# Patient Record
Sex: Female | Born: 1935 | Race: White | Hispanic: No | Marital: Single | Smoking: Former smoker
Health system: Southern US, Community
[De-identification: ages and names within clinical notes are randomized; demographics above are authoritative.]

## PROBLEM LIST (undated history)

## (undated) DIAGNOSIS — N390 Urinary tract infection, site not specified: Secondary | ICD-10-CM

## (undated) DIAGNOSIS — E042 Nontoxic multinodular goiter: Secondary | ICD-10-CM

## (undated) DIAGNOSIS — C50919 Malignant neoplasm of unspecified site of unspecified female breast: Secondary | ICD-10-CM

## (undated) DIAGNOSIS — Z860100 Personal history of colon polyps, unspecified: Secondary | ICD-10-CM

## (undated) DIAGNOSIS — M25473 Effusion, unspecified ankle: Secondary | ICD-10-CM

## (undated) DIAGNOSIS — B9620 Unspecified Escherichia coli [E. coli] as the cause of diseases classified elsewhere: Secondary | ICD-10-CM

## (undated) HISTORY — DX: Personal history of colonic polyps: Z86.010

## (undated) HISTORY — PX: ABDOMINAL HYSTERECTOMY: SHX81

## (undated) HISTORY — DX: Effusion, unspecified ankle: M25.473

## (undated) HISTORY — DX: Personal history of colon polyps, unspecified: Z86.0100

## (undated) HISTORY — DX: Urinary tract infection, site not specified: N39.0

## (undated) HISTORY — DX: Nontoxic multinodular goiter: E04.2

## (undated) HISTORY — PX: TOTAL KNEE ARTHROPLASTY: SHX125

## (undated) HISTORY — DX: Malignant neoplasm of unspecified site of unspecified female breast: C50.919

## (undated) HISTORY — PX: MASTECTOMY: SHX3

## (undated) HISTORY — PX: BILATERAL SALPINGOOPHORECTOMY: SHX1223

## (undated) HISTORY — PX: COLONOSCOPY W/ POLYPECTOMY: SHX1380

## (undated) HISTORY — PX: TONSILLECTOMY: SUR1361

## (undated) HISTORY — DX: Unspecified Escherichia coli (E. coli) as the cause of diseases classified elsewhere: B96.20

## (undated) HISTORY — PX: CATARACT EXTRACTION, BILATERAL: SHX1313

## (undated) HISTORY — PX: APPENDECTOMY: SHX54

---

## 1998-04-21 ENCOUNTER — Ambulatory Visit (HOSPITAL_COMMUNITY): Admission: RE | Admit: 1998-04-21 | Discharge: 1998-04-21 | Payer: Self-pay | Admitting: Anesthesiology

## 1999-06-30 ENCOUNTER — Ambulatory Visit (HOSPITAL_COMMUNITY): Admission: RE | Admit: 1999-06-30 | Discharge: 1999-06-30 | Payer: Self-pay | Admitting: Obstetrics & Gynecology

## 2000-04-12 ENCOUNTER — Ambulatory Visit (HOSPITAL_COMMUNITY): Admission: RE | Admit: 2000-04-12 | Discharge: 2000-04-12 | Payer: Self-pay | Admitting: Internal Medicine

## 2000-04-13 ENCOUNTER — Ambulatory Visit (HOSPITAL_COMMUNITY): Admission: RE | Admit: 2000-04-13 | Discharge: 2000-04-13 | Payer: Self-pay | Admitting: Internal Medicine

## 2000-08-16 ENCOUNTER — Encounter: Payer: Self-pay | Admitting: Internal Medicine

## 2000-08-16 ENCOUNTER — Ambulatory Visit (HOSPITAL_COMMUNITY): Admission: RE | Admit: 2000-08-16 | Discharge: 2000-08-16 | Payer: Self-pay | Admitting: Internal Medicine

## 2001-02-13 ENCOUNTER — Ambulatory Visit (HOSPITAL_COMMUNITY): Admission: RE | Admit: 2001-02-13 | Discharge: 2001-02-13 | Payer: Self-pay | Admitting: Neurology

## 2001-02-13 ENCOUNTER — Encounter: Payer: Self-pay | Admitting: Neurology

## 2002-09-12 ENCOUNTER — Encounter: Admission: RE | Admit: 2002-09-12 | Discharge: 2002-09-12 | Payer: Self-pay | Admitting: Internal Medicine

## 2002-09-12 ENCOUNTER — Encounter: Payer: Self-pay | Admitting: Internal Medicine

## 2003-06-26 ENCOUNTER — Encounter: Payer: Self-pay | Admitting: Orthopedic Surgery

## 2003-07-06 ENCOUNTER — Inpatient Hospital Stay (HOSPITAL_COMMUNITY): Admission: RE | Admit: 2003-07-06 | Discharge: 2003-07-08 | Payer: Self-pay | Admitting: Orthopedic Surgery

## 2003-07-08 ENCOUNTER — Inpatient Hospital Stay (HOSPITAL_COMMUNITY)
Admission: RE | Admit: 2003-07-08 | Discharge: 2003-07-16 | Payer: Self-pay | Admitting: Physical Medicine & Rehabilitation

## 2003-07-11 ENCOUNTER — Encounter: Payer: Self-pay | Admitting: Physical Medicine & Rehabilitation

## 2004-01-29 ENCOUNTER — Other Ambulatory Visit: Admission: RE | Admit: 2004-01-29 | Discharge: 2004-01-29 | Payer: Self-pay | Admitting: Obstetrics and Gynecology

## 2004-08-09 ENCOUNTER — Ambulatory Visit (HOSPITAL_COMMUNITY): Admission: RE | Admit: 2004-08-09 | Discharge: 2004-08-09 | Payer: Self-pay | Admitting: Internal Medicine

## 2004-11-08 ENCOUNTER — Ambulatory Visit: Payer: Self-pay | Admitting: Internal Medicine

## 2005-03-28 ENCOUNTER — Ambulatory Visit: Payer: Self-pay | Admitting: Family Medicine

## 2005-04-11 ENCOUNTER — Ambulatory Visit: Payer: Self-pay | Admitting: Internal Medicine

## 2005-06-16 ENCOUNTER — Ambulatory Visit: Payer: Self-pay | Admitting: Internal Medicine

## 2005-09-14 ENCOUNTER — Ambulatory Visit: Payer: Self-pay | Admitting: Internal Medicine

## 2005-11-08 ENCOUNTER — Encounter: Admission: RE | Admit: 2005-11-08 | Discharge: 2005-11-08 | Payer: Self-pay | Admitting: Internal Medicine

## 2005-11-08 ENCOUNTER — Ambulatory Visit: Payer: Self-pay | Admitting: Internal Medicine

## 2005-11-14 ENCOUNTER — Ambulatory Visit (HOSPITAL_COMMUNITY): Admission: RE | Admit: 2005-11-14 | Discharge: 2005-11-14 | Payer: Self-pay | Admitting: Internal Medicine

## 2005-11-17 ENCOUNTER — Ambulatory Visit (HOSPITAL_COMMUNITY): Admission: RE | Admit: 2005-11-17 | Discharge: 2005-11-17 | Payer: Self-pay | Admitting: Internal Medicine

## 2005-11-24 ENCOUNTER — Ambulatory Visit: Payer: Self-pay | Admitting: Internal Medicine

## 2005-12-22 ENCOUNTER — Ambulatory Visit (HOSPITAL_COMMUNITY): Admission: RE | Admit: 2005-12-22 | Discharge: 2005-12-22 | Payer: Self-pay | Admitting: Surgery

## 2005-12-22 ENCOUNTER — Encounter (INDEPENDENT_AMBULATORY_CARE_PROVIDER_SITE_OTHER): Payer: Self-pay | Admitting: *Deleted

## 2005-12-25 ENCOUNTER — Ambulatory Visit: Payer: Self-pay | Admitting: Internal Medicine

## 2006-06-22 ENCOUNTER — Ambulatory Visit: Payer: Self-pay | Admitting: Internal Medicine

## 2006-06-28 ENCOUNTER — Ambulatory Visit: Payer: Self-pay | Admitting: Internal Medicine

## 2006-06-28 ENCOUNTER — Ambulatory Visit (HOSPITAL_COMMUNITY): Admission: RE | Admit: 2006-06-28 | Discharge: 2006-06-28 | Payer: Self-pay | Admitting: Surgery

## 2006-11-19 ENCOUNTER — Ambulatory Visit: Payer: Self-pay | Admitting: Internal Medicine

## 2007-06-28 ENCOUNTER — Ambulatory Visit: Payer: Self-pay | Admitting: Internal Medicine

## 2007-06-28 DIAGNOSIS — E042 Nontoxic multinodular goiter: Secondary | ICD-10-CM

## 2007-06-28 DIAGNOSIS — R32 Unspecified urinary incontinence: Secondary | ICD-10-CM

## 2007-06-28 DIAGNOSIS — K5900 Constipation, unspecified: Secondary | ICD-10-CM | POA: Insufficient documentation

## 2007-06-28 DIAGNOSIS — R609 Edema, unspecified: Secondary | ICD-10-CM

## 2007-06-29 ENCOUNTER — Encounter: Payer: Self-pay | Admitting: Internal Medicine

## 2007-07-03 ENCOUNTER — Encounter: Payer: Self-pay | Admitting: Internal Medicine

## 2007-07-09 ENCOUNTER — Encounter (INDEPENDENT_AMBULATORY_CARE_PROVIDER_SITE_OTHER): Payer: Self-pay | Admitting: *Deleted

## 2007-07-16 ENCOUNTER — Ambulatory Visit: Payer: Self-pay | Admitting: Internal Medicine

## 2007-07-17 ENCOUNTER — Encounter (INDEPENDENT_AMBULATORY_CARE_PROVIDER_SITE_OTHER): Payer: Self-pay | Admitting: *Deleted

## 2007-08-01 ENCOUNTER — Ambulatory Visit (HOSPITAL_COMMUNITY): Admission: RE | Admit: 2007-08-01 | Discharge: 2007-08-01 | Payer: Self-pay | Admitting: Surgery

## 2007-08-19 ENCOUNTER — Encounter: Payer: Self-pay | Admitting: Internal Medicine

## 2007-09-23 ENCOUNTER — Telehealth: Payer: Self-pay | Admitting: Internal Medicine

## 2007-09-24 ENCOUNTER — Encounter: Payer: Self-pay | Admitting: Internal Medicine

## 2007-09-24 ENCOUNTER — Encounter: Admission: RE | Admit: 2007-09-24 | Discharge: 2007-11-15 | Payer: Self-pay | Admitting: Internal Medicine

## 2007-10-03 ENCOUNTER — Encounter: Payer: Self-pay | Admitting: Internal Medicine

## 2007-10-29 ENCOUNTER — Ambulatory Visit: Payer: Self-pay | Admitting: Internal Medicine

## 2007-11-28 DIAGNOSIS — M25473 Effusion, unspecified ankle: Secondary | ICD-10-CM

## 2007-11-28 HISTORY — DX: Effusion, unspecified ankle: M25.473

## 2007-12-12 ENCOUNTER — Telehealth (INDEPENDENT_AMBULATORY_CARE_PROVIDER_SITE_OTHER): Payer: Self-pay | Admitting: *Deleted

## 2007-12-30 ENCOUNTER — Ambulatory Visit: Payer: Self-pay | Admitting: Internal Medicine

## 2008-01-01 ENCOUNTER — Encounter: Payer: Self-pay | Admitting: Internal Medicine

## 2008-01-02 ENCOUNTER — Telehealth (INDEPENDENT_AMBULATORY_CARE_PROVIDER_SITE_OTHER): Payer: Self-pay | Admitting: *Deleted

## 2008-01-08 ENCOUNTER — Encounter: Payer: Self-pay | Admitting: Internal Medicine

## 2008-02-07 ENCOUNTER — Telehealth (INDEPENDENT_AMBULATORY_CARE_PROVIDER_SITE_OTHER): Payer: Self-pay | Admitting: *Deleted

## 2008-03-03 ENCOUNTER — Telehealth: Payer: Self-pay | Admitting: Internal Medicine

## 2008-03-05 ENCOUNTER — Ambulatory Visit: Payer: Self-pay | Admitting: Internal Medicine

## 2008-03-08 LAB — CONVERTED CEMR LAB
ALT: 16 units/L (ref 0–35)
BUN: 15 mg/dL (ref 6–23)
Creatinine, Ser: 0.56 mg/dL (ref 0.40–1.20)
Potassium: 3.7 meq/L (ref 3.5–5.3)
Total Protein: 7 g/dL (ref 6.0–8.3)

## 2008-03-09 ENCOUNTER — Encounter (INDEPENDENT_AMBULATORY_CARE_PROVIDER_SITE_OTHER): Payer: Self-pay | Admitting: *Deleted

## 2008-04-23 ENCOUNTER — Telehealth (INDEPENDENT_AMBULATORY_CARE_PROVIDER_SITE_OTHER): Payer: Self-pay | Admitting: *Deleted

## 2008-06-03 ENCOUNTER — Ambulatory Visit: Payer: Self-pay | Admitting: Internal Medicine

## 2008-06-03 DIAGNOSIS — M25559 Pain in unspecified hip: Secondary | ICD-10-CM

## 2008-06-04 ENCOUNTER — Encounter (INDEPENDENT_AMBULATORY_CARE_PROVIDER_SITE_OTHER): Payer: Self-pay | Admitting: *Deleted

## 2008-06-05 ENCOUNTER — Encounter (INDEPENDENT_AMBULATORY_CARE_PROVIDER_SITE_OTHER): Payer: Self-pay | Admitting: *Deleted

## 2008-06-05 ENCOUNTER — Ambulatory Visit (HOSPITAL_COMMUNITY): Admission: RE | Admit: 2008-06-05 | Discharge: 2008-06-05 | Payer: Self-pay | Admitting: Internal Medicine

## 2008-06-24 ENCOUNTER — Ambulatory Visit: Payer: Self-pay | Admitting: Internal Medicine

## 2008-07-10 ENCOUNTER — Telehealth (INDEPENDENT_AMBULATORY_CARE_PROVIDER_SITE_OTHER): Payer: Self-pay | Admitting: *Deleted

## 2008-07-13 ENCOUNTER — Ambulatory Visit: Payer: Self-pay | Admitting: Internal Medicine

## 2008-07-13 ENCOUNTER — Observation Stay (HOSPITAL_COMMUNITY): Admission: EM | Admit: 2008-07-13 | Discharge: 2008-07-14 | Payer: Self-pay | Admitting: Family Medicine

## 2008-07-16 ENCOUNTER — Encounter: Payer: Self-pay | Admitting: Internal Medicine

## 2008-07-30 ENCOUNTER — Ambulatory Visit (HOSPITAL_COMMUNITY): Admission: RE | Admit: 2008-07-30 | Discharge: 2008-07-30 | Payer: Self-pay | Admitting: Surgery

## 2008-08-10 ENCOUNTER — Encounter: Payer: Self-pay | Admitting: Internal Medicine

## 2009-05-10 ENCOUNTER — Ambulatory Visit: Payer: Self-pay | Admitting: Internal Medicine

## 2009-05-10 DIAGNOSIS — Z860100 Personal history of colon polyps, unspecified: Secondary | ICD-10-CM | POA: Insufficient documentation

## 2009-05-10 DIAGNOSIS — M810 Age-related osteoporosis without current pathological fracture: Secondary | ICD-10-CM | POA: Insufficient documentation

## 2009-05-10 LAB — CONVERTED CEMR LAB
Basophils Absolute: 0 10*3/uL (ref 0.0–0.1)
Calcium: 9 mg/dL (ref 8.4–10.5)
Creatinine, Ser: 0.6 mg/dL (ref 0.4–1.2)
Eosinophils Absolute: 0.1 10*3/uL (ref 0.0–0.7)
Hemoglobin: 13.2 g/dL (ref 12.0–15.0)
Lymphocytes Relative: 28.6 % (ref 12.0–46.0)
MCHC: 34 g/dL (ref 30.0–36.0)
Monocytes Relative: 9.9 % (ref 3.0–12.0)
Neutrophils Relative %: 59.3 % (ref 43.0–77.0)
Platelets: 140 10*3/uL — ABNORMAL LOW (ref 150.0–400.0)
Potassium: 4 meq/L (ref 3.5–5.1)
RDW: 13.3 % (ref 11.5–14.6)
TSH: 0.41 microintl units/mL (ref 0.35–5.50)

## 2009-05-11 ENCOUNTER — Encounter (INDEPENDENT_AMBULATORY_CARE_PROVIDER_SITE_OTHER): Payer: Self-pay | Admitting: *Deleted

## 2009-05-14 LAB — CONVERTED CEMR LAB: Vit D, 25-Hydroxy: 66 ng/mL (ref 30–89)

## 2009-05-17 ENCOUNTER — Encounter (INDEPENDENT_AMBULATORY_CARE_PROVIDER_SITE_OTHER): Payer: Self-pay | Admitting: *Deleted

## 2009-05-28 ENCOUNTER — Encounter: Payer: Self-pay | Admitting: Internal Medicine

## 2009-05-28 ENCOUNTER — Telehealth (INDEPENDENT_AMBULATORY_CARE_PROVIDER_SITE_OTHER): Payer: Self-pay | Admitting: *Deleted

## 2009-07-29 ENCOUNTER — Ambulatory Visit (HOSPITAL_COMMUNITY): Admission: RE | Admit: 2009-07-29 | Discharge: 2009-07-29 | Payer: Self-pay | Admitting: Surgery

## 2009-08-06 ENCOUNTER — Ambulatory Visit: Payer: Self-pay | Admitting: Internal Medicine

## 2009-08-06 DIAGNOSIS — M2550 Pain in unspecified joint: Secondary | ICD-10-CM | POA: Insufficient documentation

## 2009-08-06 LAB — CONVERTED CEMR LAB: Rhuematoid fact SerPl-aCnc: <20 intl units/mL (ref 0.0–20.0)

## 2009-08-10 ENCOUNTER — Encounter (INDEPENDENT_AMBULATORY_CARE_PROVIDER_SITE_OTHER): Payer: Self-pay | Admitting: *Deleted

## 2009-08-10 ENCOUNTER — Telehealth (INDEPENDENT_AMBULATORY_CARE_PROVIDER_SITE_OTHER): Payer: Self-pay | Admitting: *Deleted

## 2009-08-20 ENCOUNTER — Encounter: Payer: Self-pay | Admitting: Internal Medicine

## 2009-09-07 ENCOUNTER — Encounter: Payer: Self-pay | Admitting: Internal Medicine

## 2009-09-21 ENCOUNTER — Encounter: Payer: Self-pay | Admitting: Internal Medicine

## 2009-09-24 ENCOUNTER — Telehealth: Payer: Self-pay | Admitting: Internal Medicine

## 2009-09-29 ENCOUNTER — Telehealth (INDEPENDENT_AMBULATORY_CARE_PROVIDER_SITE_OTHER): Payer: Self-pay | Admitting: *Deleted

## 2009-10-01 ENCOUNTER — Ambulatory Visit: Payer: Self-pay | Admitting: Internal Medicine

## 2009-10-04 ENCOUNTER — Encounter (INDEPENDENT_AMBULATORY_CARE_PROVIDER_SITE_OTHER): Payer: Self-pay | Admitting: *Deleted

## 2009-10-04 LAB — CONVERTED CEMR LAB
Free T4: 1.2 ng/dL (ref 0.6–1.6)
T3, Free: 3.1 pg/mL (ref 2.3–4.2)
TSH: 0.53 microintl units/mL (ref 0.35–5.50)

## 2009-10-15 ENCOUNTER — Ambulatory Visit: Payer: Self-pay | Admitting: Internal Medicine

## 2009-11-15 ENCOUNTER — Encounter: Payer: Self-pay | Admitting: Internal Medicine

## 2009-11-30 ENCOUNTER — Encounter: Payer: Self-pay | Admitting: Internal Medicine

## 2010-01-04 ENCOUNTER — Encounter: Payer: Self-pay | Admitting: Internal Medicine

## 2010-01-25 ENCOUNTER — Ambulatory Visit: Payer: Self-pay | Admitting: Internal Medicine

## 2010-01-25 DIAGNOSIS — Z853 Personal history of malignant neoplasm of breast: Secondary | ICD-10-CM

## 2010-02-04 ENCOUNTER — Ambulatory Visit: Payer: Self-pay | Admitting: Internal Medicine

## 2010-02-10 LAB — CONVERTED CEMR LAB
ALT: 21 units/L (ref 0–35)
AST: 28 units/L (ref 0–37)
Calcium: 9 mg/dL (ref 8.4–10.5)
Cholesterol: 177 mg/dL (ref 0–200)
GFR calc non Af Amer: 101.05 mL/min (ref >60)
Glucose, Bld: 90 mg/dL (ref 70–99)
HDL: 79.3 mg/dL (ref >39.00)
Potassium: 4.2 meq/L (ref 3.5–5.1)
Sodium: 141 meq/L (ref 135–145)
TSH: 0.61 microintl units/mL (ref 0.35–5.50)
Total Bilirubin: 0.6 mg/dL (ref 0.3–1.2)
Total Protein: 6.6 g/dL (ref 6.0–8.3)

## 2010-02-14 ENCOUNTER — Encounter: Payer: Self-pay | Admitting: Internal Medicine

## 2010-04-15 ENCOUNTER — Encounter: Payer: Self-pay | Admitting: Internal Medicine

## 2010-04-19 ENCOUNTER — Encounter: Payer: Self-pay | Admitting: Internal Medicine

## 2010-04-26 ENCOUNTER — Ambulatory Visit: Payer: Self-pay | Admitting: Internal Medicine

## 2010-04-26 DIAGNOSIS — R030 Elevated blood-pressure reading, without diagnosis of hypertension: Secondary | ICD-10-CM | POA: Insufficient documentation

## 2010-04-26 DIAGNOSIS — R7309 Other abnormal glucose: Secondary | ICD-10-CM | POA: Insufficient documentation

## 2010-05-02 LAB — CONVERTED CEMR LAB
Creatinine,U: 13.7 mg/dL
Microalb Creat Ratio: 9.5 mg/g (ref 0.0–30.0)

## 2010-05-12 ENCOUNTER — Telehealth (INDEPENDENT_AMBULATORY_CARE_PROVIDER_SITE_OTHER): Payer: Self-pay | Admitting: *Deleted

## 2010-08-05 ENCOUNTER — Telehealth: Payer: Self-pay | Admitting: Internal Medicine

## 2010-08-15 ENCOUNTER — Telehealth (INDEPENDENT_AMBULATORY_CARE_PROVIDER_SITE_OTHER): Payer: Self-pay | Admitting: *Deleted

## 2010-09-09 ENCOUNTER — Ambulatory Visit: Payer: Self-pay | Admitting: Internal Medicine

## 2010-09-09 DIAGNOSIS — IMO0001 Reserved for inherently not codable concepts without codable children: Secondary | ICD-10-CM

## 2010-09-09 DIAGNOSIS — M1710 Unilateral primary osteoarthritis, unspecified knee: Secondary | ICD-10-CM

## 2010-09-09 DIAGNOSIS — H04129 Dry eye syndrome of unspecified lacrimal gland: Secondary | ICD-10-CM | POA: Insufficient documentation

## 2010-09-12 LAB — CONVERTED CEMR LAB
Calcium: 9.9 mg/dL (ref 8.4–10.5)
Magnesium: 2.2 mg/dL (ref 1.5–2.5)
Vit D, 25-Hydroxy: 75 ng/mL (ref 30–89)

## 2010-11-09 ENCOUNTER — Emergency Department (HOSPITAL_COMMUNITY)
Admission: EM | Admit: 2010-11-09 | Discharge: 2010-11-09 | Payer: Self-pay | Source: Home / Self Care | Admitting: Family Medicine

## 2010-12-16 ENCOUNTER — Telehealth (INDEPENDENT_AMBULATORY_CARE_PROVIDER_SITE_OTHER): Payer: Self-pay | Admitting: *Deleted

## 2010-12-17 ENCOUNTER — Encounter: Payer: Self-pay | Admitting: Internal Medicine

## 2010-12-18 ENCOUNTER — Encounter: Payer: Self-pay | Admitting: Surgery

## 2010-12-25 LAB — CONVERTED CEMR LAB
Albumin: 4 g/dL (ref 3.5–5.2)
Alkaline Phosphatase: 56 units/L (ref 39–117)
BUN: 8 mg/dL (ref 6–23)
Basophils Absolute: 0 10*3/uL (ref 0.0–0.1)
Cholesterol: 165 mg/dL (ref 0–200)
Creatinine, Ser: 0.6 mg/dL (ref 0.4–1.2)
Free T4: 1.1 ng/dL (ref 0.6–1.6)
GFR calc Af Amer: 123 mL/min
HCT: 42 % (ref 36.0–46.0)
HDL: 68.3 mg/dL (ref >39.0)
Hemoglobin: 14.4 g/dL (ref 12.0–15.0)
Lymphocytes Relative: 38.3 % (ref 12.0–46.0)
MCHC: 34.2 g/dL (ref 30.0–36.0)
MCV: 91.1 fL (ref 78.0–100.0)
Monocytes Absolute: 0.7 10*3/uL (ref 0.2–0.7)
Monocytes Relative: 9.6 % (ref 3.0–11.0)
Neutro Abs: 3.4 10*3/uL (ref 1.4–7.7)
Neutrophils Relative %: 51 % (ref 43.0–77.0)
Potassium: 4 meq/L (ref 3.5–5.1)
Sodium: 137 meq/L (ref 135–145)
T3, Free: 2.9 pg/mL (ref 2.3–4.2)
TSH: 0.39 microintl units/mL (ref 0.35–5.50)
Total Bilirubin: 1 mg/dL (ref 0.3–1.2)
Total Protein: 6.7 g/dL (ref 6.0–8.3)

## 2010-12-29 NOTE — Progress Notes (Signed)
Summary: REFILL FOR FUROSIMIDE, BUT NEEDS THE 20 MG  Phone Note Call from Patient Call back at [facility] Phone [phone]   Caller: Patient Summary of Call: NEEDS REFILL FOR [facility]OSOMIDE [facility] [address] [address]  PATIENT WOULD PREFER A 90 DAY SUPPLY, BUT WOULD LIKE 20 MG INSTEAD OF 40 MG DUE TO CONVENIENCE BECAUSE SOMEDAYS SHE TAKES 20 MG AND SOME DAYS SHE SAYS SHE NEEDS THE 40 MG Initial call taken by: Jerolyn Shin,  May 12, 2010 4:23 PM    New/Updated Medications: FUROSEMIDE 20 MG TABS (FUROSEMIDE) 1-2 by mouth once daily Prescriptions: FUROSEMIDE 20 MG TABS (FUROSEMIDE) 1-2 by mouth once daily  #180 x 0   Entered by:   Shonna Chock   Authorized by:   Marga Melnick MD   Signed by:   Shonna Chock on 05/12/2010   Method used:   Electronically to        [facility] [phone]* (retail)       [address]       [place], [place]  [address]       Ph: [phone]       Fax: [phone]   RxID:   [id number]

## 2010-12-29 NOTE — Letter (Signed)
Summary: Stacey Drain MD Rheumatology  Stacey Drain MD Rheumatology   Imported By: Lanelle Bal 01/13/2010 08:41:14  _____________________________________________________________________  External Attachment:    Type:   Image     Comment:   External Document

## 2010-12-29 NOTE — Progress Notes (Signed)
Summary: patient decided on med  Phone Note Call from Patient Call back at Work Phone [phone]   Caller: Patient Summary of Call: patient wants rx for ditropan long acting in place of  [facility] [address] Initial call taken by: Okey Regal Spring,  August 15, 2010 10:20 AM  Follow-up for Phone Call        see Rx Follow-up by: Marga Melnick MD,  August 15, 2010 1:19 PM  Additional Follow-up for Phone Call Additional follow up Details #1::        left detailed msg prescription sent to pharmacy ...Marland KitchenMarland KitchenDoristine Devoid CMA  August 15, 2010 4:40 PM     New/Updated Medications: OXYBUTYNIN CHLORIDE 5 MG XR24H-TAB (OXYBUTYNIN CHLORIDE) 1 once daily Prescriptions: OXYBUTYNIN CHLORIDE 5 MG XR24H-TAB (OXYBUTYNIN CHLORIDE) 1 once daily  #30 x Rx5   Entered by:   Marga Melnick MD   Authorized by:   Loreen Freud DO   Signed by:   Marga Melnick MD on 08/15/2010   Method used:   Faxed to ...       [facility] [phone]* (retail)       [address]       [place], [place]  [address]       Ph: [phone]       Fax: [phone]   RxID:   [id number]

## 2010-12-29 NOTE — Consult Note (Signed)
Summary: [facility]  [facility]   Imported By: Lanelle Bal 04/30/2010 09:15:41  _____________________________________________________________________  External Attachment:    Type:   Image     Comment:   External Document

## 2010-12-29 NOTE — Progress Notes (Signed)
Summary: med too expensive  Phone Note Refill Request Call back at [phone]   Refills Requested: Medication #1:  VESICARE 5 MG  TABS 1 by mouth once daily pt states that med is too expensive would like to have a generic or a med that is equivalent to it. [facility] [address] pls advise...........Marland KitchenFelecia Deloach CMA  August 05, 2010 2:50 PM    Follow-up for Phone Call        per dr hopper pt can call pharmacy and ask them for cheaper alternative or she can bring in a copy of her formulary. pt ok, verbalized understanding ...............Marland KitchenFelecia Deloach CMA  August 05, 2010 5:05 PM

## 2010-12-29 NOTE — Assessment & Plan Note (Signed)
Summary: DISCUSS VIT D & BRAC TEST/RH......Marland Kitchen   Vital Signs:  Patient profile:   77 year old female Weight:      125.6 pounds Pulse rate:   72 / minute Resp:     17 per minute BP sitting:   122 / 78  (left arm) Cuff size:   regular  Vitals Entered By: Shonna Chock (January 25, 2010 4:21 PM) CC: Discuss getting an order for a new Vit D test and for a BRAC test Comments REVIEWED MED LIST, PATIENT AGREED DOSE AND INSTRUCTION CORRECT    CC:  Discuss getting an order for a new Vit D test and for a BRAC test.  History of Present Illness: On Fosamax 70 mg weekly as per Dr Kellie Simmering for Osteoporosis.His 01/04/2010 OV was reviewed. She is on vitamin D 1000 International Units once daily ; her level was 66 in 04/2009.She is also oon Caltrate D  600  two times a day . She is off MTX & Prednisone . Her daughter is questioning BRAC testing. Edema persists despite  Lasix  20 mg once daily & stockings.Not on Amlodipine; no excess salt intake.  Allergies: 1)  ! Pcn 2)  ! Sulfa  Past History:  Past Medical History: G 7 P6 goiter, nontoxic, multinodular urinary incontinence ankle edema constipation Colonic polyps, hx of Osteoporosis Breast cancer, bilaterally , hx of  Family History: Father: lung Cancer Mother: uremic poisoning due to RA Siblings: bro ruptured AAA; M aunt breast CA  Review of Systems CV:  Complains of swelling of feet; denies bluish discoloration of lips or nails, difficulty breathing at night, difficulty breathing while lying down, and swelling of hands; Cold induced digit change.  Physical Exam  General:  Thin ; alert,appropriate and cooperative throughout examination Lungs:  Normal respiratory effort, chest expands symmetrically. Lungs are clear to auscultation, no crackles or wheezes. Heart:  Normal rate and regular rhythm. S1 and S2 normal without gallop, murmur, click, rub. S4;no JVD and no HJR.   Extremities:  1+ left pedal edema and 1+ right pedal edema.  Venous  insufficiency   Impression & Recommendations:  Problem # 1:  ANKLE EDEMA (ICD-782.3)  The following medications were removed from the medication list:    Hydrochlorothiazide 25 Mg Tabs (Hydrochlorothiazide) .Marland Kitchen... 1/2 by mouth 3-4 x weekly Her updated medication list for this problem includes:    Furosemide 20 Mg Tabs (Furosemide) .Marland Kitchen... 1 by mouth once daily  Orders: T-2 View CXR (71020TC)  Problem # 2:  OSTEOPOROSIS (ICD-733.00)  The following medications were removed from the medication list:    Fosamax 70 Mg Tabs (Alendronate sodium) .Marland Kitchen... 1 by mouth weekly Her updated medication list for this problem includes:    Alendronate Sodium 70 Mg Tabs (Alendronate sodium) .Marland Kitchen... Weekly  Complete Medication List: 1)  Vesicare 5 Mg Tabs (Solifenacin succinate) .Marland Kitchen.. 1 by mouth once daily 2)  Calcium 600 Mg Tabs (Calcium) .Marland Kitchen.. 1 by mouth two times a day 3)  Centrum Silver Tabs (Multiple vitamins-minerals) .Marland Kitchen.. 1 by mouth once daily 4)  Vitamin D 1000 Unit Caps (Cholecalciferol) .... Take one capsule once daily 5)  Magnesium 250 Mg Tabs (Magnesium) .Marland Kitchen.. 1 by mouth once daily 6)  Fish Oil 1200 Mg Caps (Omega-3 fatty acids) .Marland Kitchen.. 1 by mouth two times a day 7)  Furosemide 20 Mg Tabs (Furosemide) .Marland Kitchen.. 1 by mouth once daily 8)  Alendronate Sodium 70 Mg Tabs (Alendronate sodium) .... Weekly  Patient Instructions: 1)  Schedule fasting labs: 2)  BMP ; vitamin D level; 3)  Hepatic Panel ; 4)  Lipid Panel ; 5)  TSH ; BNP 6)  Limit your Sodium (Salt) to less than 4 grams a day (slightly less than 1 teaspoon) to prevent fluid retention, swelling, or worsening or symptoms.

## 2010-12-29 NOTE — Letter (Signed)
Summary: Cancer Screening/[facility] Personalized Risk Profile  Cancer Screening/[facility] Personalized Risk Profile   Imported By: Lanelle Bal 02/17/2010 11:26:01  _____________________________________________________________________  External Attachment:    Type:   Image     Comment:   External Document

## 2010-12-29 NOTE — Letter (Signed)
Summary: BP & Glucose Screening/[facility]  BP & Glucose Screening/[facility]   Imported By: Lanelle Bal 05/04/2010 09:57:03  _____________________________________________________________________  External Attachment:    Type:   Image     Comment:   External Document

## 2010-12-29 NOTE — Letter (Signed)
Summary: Stacey Drain MD Rheumatology  Stacey Drain MD Rheumatology   Imported By: Lanelle Bal 02/26/2010 11:53:18  _____________________________________________________________________  External Attachment:    Type:   Image     Comment:   External Document

## 2010-12-29 NOTE — Assessment & Plan Note (Signed)
Summary: for dry eyes and other problem//ph   Vital Signs:  Patient profile:   77 year old female Weight:      118.4 pounds BMI:     22.09 Temp:     98.1 degrees F oral Pulse rate:   84 / minute Resp:     18 per minute BP sitting:   124 / 76  (left arm) Cuff size:   regular  Vitals Entered By: Shonna Chock CMA (September 09, 2010 10:40 AM) CC: 1.) Dry eye concerns - left eye is worse  2.)Neck concerns -left side  3.) Question how much magnesium to take  4.) Discuss joint pain and veins in legs, Lower Extremity Joint pain   CC:  1.) Dry eye concerns - left eye is worse  2.)Neck concerns -left side  3.) Question how much magnesium to take  4.) Discuss joint pain and veins in legs and Lower Extremity Joint pain.  History of Present Illness: Lower Extremity Joint Pain      This is an 77 year old woman who presents with Lower Extremity Joint pain for 12 months.  The patient reports swelling, stiffness  with prolonged standing, decreased ROM, and weakness, but denies redness, giving away, locking, and popping.  The pain is located in the left knee.  The pain began gradually and with no injury.  The pain is described as aching, intermittent, and activity(standing & walking > 5 min) related. She has had dry eyes for 6-8 months with  photosensitivity .The patient denies the following symptoms: fever, rash, diarrhea, and dysuria.  Rx: Aleve with benefit. PMH of R TKR 2006 by Dr Despina Hick.She indicates she would be reluctant to pursue TKR of the L knee.                                                She has several other issues she wishes to discuss, especially dry eyes & need for Mg++  supplementation. (see ROS)  Current Medications (verified): 1)  Calcium 600 Mg Tabs (Calcium) .Marland Kitchen.. 1 By Mouth Two Times A Day 2)  Centrum Silver  Tabs (Multiple Vitamins-Minerals) .Marland Kitchen.. 1 By Mouth Once Daily 3)  Vitamin D 1000 Unit  Caps (Cholecalciferol) .... Take One Capsule Once Daily 4)  Magnesium Oxide 400 Mg Caps  (Magnesium Oxide) .Marland Kitchen.. 1 By Mouth Once Daily 5)  Fish Oil 1200 Mg Caps (Omega-3 Fatty Acids) .Marland Kitchen.. 1 By Mouth Two Times A Day 6)  Furosemide 20 Mg Tabs (Furosemide) .Marland Kitchen.. 1-2 By Mouth Once Daily 7)  Oxybutynin Chloride 5 Mg Xr24h-Tab (Oxybutynin Chloride) .Marland Kitchen.. 1 Once Daily  Allergies: 1)  ! Pcn 2)  ! Sulfa  Review of Systems Eyes:  Denies discharge, eye pain, and red eye. GI:  Denies constipation and diarrhea. MS:  Complains of muscle aches; She is unsure who Rxed magnesium. She woke with neck pain this am.. Derm:  Denies lesion(s) and rash. Neuro:  Denies brief paralysis, numbness, and tingling.  Physical Exam  General:  in no acute distress; alert,appropriate and cooperative throughout examination Eyes:  No corneal or conjunctival inflammation noted. EOMI. Pupils small. Tearing appears normal. Vision grossly normal. Neck:  No deformities, masses; thyroid normal. Decreased ROM due to pain Msk:  Lay down & sat up  with minimal  help Extremities:  Lipedema @ ankles; varicose veins. Fusiform L knee with crepitus,  no definite effusion. Hands essentially normal. Neurologic:  alert & oriented X3, strength normal in all extremities, and DTRs symmetrical and normal.     Impression & Recommendations:  Problem # 1:  OSTEOARTHRITIS, KNEE, SEVERE (ICD-715.96) end stage Her updated medication list for this problem includes:    Tramadol Hcl 50 Mg Tabs (Tramadol hcl) .Marland Kitchen... 1/2 -1  every 6 hrs as needed for pain  Orders: Prescription Created Electronically [phone])  Problem # 2:  MUSCLE PAIN (ICD-729.1)  Orders: Venipuncture (56213) TLB-Calcium (82310-CA) TLB-Magnesium (Mg) (83735-MG)  Her updated medication list for this problem includes:    Tramadol Hcl 50 Mg Tabs (Tramadol hcl) .Marland Kitchen... 1/2 -1  every 6 hrs as needed for pain  Problem # 3:  DRY EYE SYNDROME (ICD-375.15)  Orders: Ophthalmology Referral (Ophthalmology)  Problem # 4:  OSTEOPOROSIS (ICD-733.00)  Orders: Venipuncture  (08657) T-Vitamin D (25-Hydroxy) (84696-29528)  Complete Medication List: 1)  Calcium 600 Mg Tabs (Calcium) .Marland Kitchen.. 1 by mouth two times a day 2)  Centrum Silver Tabs (Multiple vitamins-minerals) .Marland Kitchen.. 1 by mouth once daily 3)  Vitamin D 1000 Unit Caps (Cholecalciferol) .... Take one capsule once daily 4)  Magnesium Oxide 400 Mg Caps (Magnesium oxide) .Marland Kitchen.. 1 by mouth once daily 5)  Fish Oil 1200 Mg Caps (Omega-3 fatty acids) .Marland Kitchen.. 1 by mouth two times a day 6)  Furosemide 20 Mg Tabs (Furosemide) .Marland Kitchen.. 1-2 by mouth once daily 7)  Oxybutynin Chloride 5 Mg Xr24h-tab (Oxybutynin chloride) .Marland Kitchen.. 1 once daily 8)  Tramadol Hcl 50 Mg Tabs (Tramadol hcl) .... 1/2 -1  every 6 hrs as needed for pain  Patient Instructions: 1)  To Whom It May Concern: Mrs. Gershon Mussel can not stand for extended periods due to degenerative changes (Osteoarthritis ) in her knees. Prescriptions: TRAMADOL HCL 50 MG TABS (TRAMADOL HCL) 1/2 -1  every 6 hrs as needed for pain  #30 x 1   Entered and Authorized by:   Marga Melnick MD   Signed by:   Marga Melnick MD on 09/09/2010   Method used:   Faxed to ...       [facility] [phone]* (retail)       [address]       [place], [place]  [address]       Ph: [phone]       Fax: [phone]   RxID:   [id number]

## 2010-12-29 NOTE — Assessment & Plan Note (Signed)
Summary: check bloos sugar//lch   Vital Signs:  Patient profile:   77 year old female Weight:      123.6 pounds BMI:     23.06 Temp:     98.3 degrees F oral Pulse rate:   76 / minute Resp:     17 per minute BP sitting:   116 / 70  (left arm) Cuff size:   regular  Vitals Entered By: Shonna Chock (Apr 26, 2010 9:40 AM) CC: 1.) Follow-up visit: patient had Bloodsugar checked at work through [facility], value was 144, and patient told to F/U  2.) FYI: Stopped Fosamax, Hypertension Management Comments REVIEWED MED LIST, PATIENT AGREED DOSE AND INSTRUCTION CORRECT    CC:  1.) Follow-up visit: patient had Bloodsugar checked at work through [facility], value was 144, and patient told to F/U  2.) FYI: Stopped Fosamax, and Hypertension Management.  History of Present Illness: BP was 142/70 @   screening station @ work; she had noted "feeling of being pushed". Non fasting glucose was 144.BP @ home is always < 140/90. Labs 02/04/2010 were excellent.  Hypertension History:      She complains of peripheral edema and visual symptoms, but denies headache, chest pain, palpitations, dyspnea with exertion, orthopnea, PND, neurologic problems, and syncope.        Positive major cardiovascular risk factors include female age 32 years old or older.  Negative major cardiovascular risk factors include non-tobacco-user status.     Allergies: 1)  ! Pcn 2)  ! Sulfa  Review of Systems Eyes:  Denies double vision and vision loss-both eyes; Occasional blurred vision. CV:  Complains of swelling of feet and swelling of hands; denies leg cramps with exertion, lightheadness, and near fainting; Intolerant to support hose. Neuro:  Denies brief paralysis, disturbances in coordination, numbness, poor balance, and tingling.  Physical Exam  General:  Appears younger than age;well-nourished,in no acute distress; alert,appropriate and cooperative throughout examination Lungs:  Normal respiratory effort, chest expands  symmetrically. Lungs are clear to auscultation, no crackles or wheezes. Heart:  Normal rate and regular rhythm. S1 and S2 normal without gallop, murmur, click, rub. S4 Pulses:  R and L carotid,radial  pulses are full and equal bilaterally. Slightly decreased pedal pulses Extremities:  trace- 1/2+  left pedal edema and trace- 1/2+  right pedal edema, non pitting.   Varicose veins   Impression & Recommendations:  Problem # 1:  ELEVATED BLOOD PRESSURE WITHOUT DIAGNOSIS OF HYPERTENSION (ICD-796.2)  Problem # 2:  ANKLE EDEMA (ICD-782.3)  Orders: Prescription Created Electronically [id number]  Problem # 3:  HYPERGLYCEMIA (ICD-790.29) Assessment: Comment Only  Orders: Venipuncture (60454) TLB-Microalbumin/Creat Ratio, Urine (82043-MALB) TLB-A1C / Hgb A1C (Glycohemoglobin) (83036-A1C)  Complete Medication List: 1)  Vesicare 5 Mg Tabs (Solifenacin succinate) .Marland Kitchen.. 1 by mouth once daily 2)  Calcium 600 Mg Tabs (Calcium) .Marland Kitchen.. 1 by mouth two times a day 3)  Centrum Silver Tabs (Multiple vitamins-minerals) .Marland Kitchen.. 1 by mouth once daily 4)  Vitamin D 1000 Unit Caps (Cholecalciferol) .... Take one capsule once daily 5)  Magnesium 250 Mg Tabs (Magnesium) .Marland Kitchen.. 1 by mouth once daily 6)  Fish Oil 1200 Mg Caps (Omega-3 fatty acids) .Marland Kitchen.. 1 by mouth two times a day 7)  Furosemide 40 Mg Tabs (furosemide)  .... 1/2 -1 once daily as needed  Hypertension Assessment/Plan:      The patient's hypertensive risk group is category B: At least one risk factor (excluding diabetes) with no target organ damage.  Her calculated 10 year risk  of coronary heart disease is 2 %.  Today's blood pressure is 116/70.    Patient Instructions: 1)  Elevation of legs & support indicated if swelling progresses.Limit your Sodium (Salt) to less than 4 grams a day (slightly less than 1 teaspoon) to prevent fluid retention, swelling, or worsening or symptoms.Check your Blood Pressure regularly. If it is above: 140/90 ON AVERAGE  you should  make an appointment. Prescriptions: FUROSEMIDE 40 MG TABS (FUROSEMIDE) 1/2 -1 once daily as needed  #90 x 1   Entered and Authorized by:   Marga Melnick MD   Signed by:   Marga Melnick MD on 04/26/2010   Method used:   Faxed to ...       [facility] [phone]* (retail)       [address]       [place], [place]  [address]       Ph: [phone]       Fax: [phone]   RxID:   [id number]

## 2010-12-29 NOTE — Progress Notes (Signed)
Summary: Refill Request  Phone Note Refill Request Call back at [phone] Message from:  Pharmacy on December 16, 2010 11:31 AM  Refills Requested: Medication #1:  OXYBUTYNIN CHLORIDE 5 MG XR24H-TAB 1 once daily   Dosage confirmed as above?Dosage Confirmed   Supply Requested: 3 months   Notes: Patient would like #[facility]  Initial call taken by: Harold Barban,  December 16, 2010 11:31 AM    Prescriptions: OXYBUTYNIN CHLORIDE 5 MG XR24H-TAB (OXYBUTYNIN CHLORIDE) 1 once daily  #30 x 5   Entered by:   Shonna Chock CMA   Authorized by:   Marga Melnick MD   Signed by:   Shonna Chock CMA on 12/16/2010   Method used:   Electronically to        [facility] [phone]* (retail)       [address]       [place], [place]  [address]       Ph: [phone]       Fax: [phone]   RxID:   [id number]

## 2010-12-29 NOTE — Letter (Signed)
Summary: Ana Drain, MD Rheumatology  Ana Drain, MD Rheumatology   Imported By: Lanelle Bal 12/14/2009 09:16:25  _____________________________________________________________________  External Attachment:    Type:   Image     Comment:   External Document

## 2011-01-04 ENCOUNTER — Telehealth (INDEPENDENT_AMBULATORY_CARE_PROVIDER_SITE_OTHER): Payer: Self-pay | Admitting: *Deleted

## 2011-01-12 NOTE — Progress Notes (Signed)
Summary: refills  Phone Note Refill Request Call back at Home Phone [phone] Call back at work number [phone] til 4:15 Message from:  Patient on January 04, 2011 2:29 PM  Refills Requested: Medication #1:  CALCIUM 600 MG TABS 1 by mouth two times a day  Medication #2:  CENTRUM SILVER  TABS 1 by mouth once daily  Medication #3:  FISH OIL 1200 MG CAPS 1 by mouth two times a day  Medication #4:  MAGNESIUM OXIDE 400 MG CAPS 1 by mouth once daily [facility],           ----if these medications are OTC products, paitient would like a prescription so that she can use her [facility] card for the purchase---said that Dr Alwyn Ren wrote her prescriptions before for OTC products  Initial call taken by: Jerolyn Shin,  January 04, 2011 2:30 PM    Prescriptions: FISH OIL 1200 MG CAPS (OMEGA-3 FATTY ACIDS) 1 by mouth two times a day  #82mo x prn   Entered by:   Shonna Chock CMA   Authorized by:   Marga Melnick MD   Signed by:   Shonna Chock CMA on 01/04/2011   Method used:   Electronically to        [facility] [phone]* (retail)       [address]       [place], [place]  [address]       Ph: [phone]       Fax: [phone]   RxID:   [id number] MAGNESIUM OXIDE 400 MG CAPS (MAGNESIUM OXIDE) 1 by mouth once daily  #49mo x prn   Entered by:   Shonna Chock CMA   Authorized by:   Marga Melnick MD   Signed by:   Shonna Chock CMA on 01/04/2011   Method used:   Electronically to        [facility]S  [facility] [phone]* (retail)       [address]       [place], [place]  [address]       Ph: [phone]       Fax: [phone]   RxID:   [id number] CENTRUM SILVER  TABS (MULTIPLE VITAMINS-MINERALS) 1 by mouth once daily  #53mo x prn   Entered by:   Shonna Chock CMA   Authorized by:   Marga Melnick MD   Signed by:   Shonna Chock CMA on 01/04/2011   Method used:   Electronically to        [facility]  [facility] [phone]* (retail)     [address]       [place], [place]  [address]       Ph: [phone]       Fax: [phone]   RxID:   [id number] CALCIUM 600 MG TABS (CALCIUM) 1 by mouth two times a day  #25mo x prm   Entered by:   Shonna Chock CMA   Authorized by:   Marga Melnick MD   Signed by:   Shonna Chock CMA on 01/04/2011   Method used:   Faxed to ...       [facility]S  [facility] [phone]* (retail)       [address]       [place], [place]  [address]       Ph:  [phone]       Fax: [phone]   RxID:   [id number]   Appended Document: refills Called in, flag indicated would not go through electronically  Appended Document: refills    Phone Note Call from Patient   Caller: Patient Summary of Call: Pt walked in and states that she never heard a response from yesterday. I told patient that [facility]. Patient states that she wants these rx's printed out and signed by Dr. Alwyn Ren like she had them last year. Pt doesnt want them sent to a specific pharmacy because she likes to go around and compare prices. Pt can be contacted at [phone] (tomorrow only) Initial call taken by: Lavell Islam,  January 05, 2011 5:03 PM    Prescriptions: VITAMIN D 1000 UNIT  CAPS (CHOLECALCIFEROL) Take one capsule once daily  #38mo x prn   Entered by:   Shonna Chock CMA   Authorized by:   Marga Melnick MD   Signed by:   Shonna Chock CMA on 01/06/2011   Method used:   Print then Give to Patient   RxID:   [id number] FISH OIL 1200 MG CAPS (OMEGA-3 FATTY ACIDS) 1 by mouth two times a day  #37mo x prn   Entered by:   Shonna Chock CMA   Authorized by:   Marga Melnick MD   Signed by:   Shonna Chock CMA on 01/06/2011   Method used:   Print then Give to Patient   RxID:   [id number] MAGNESIUM OXIDE 400 MG CAPS (MAGNESIUM OXIDE) 1 by mouth once daily  #60mo x prn   Entered by:   Shonna Chock CMA   Authorized by:   Marga Melnick MD    Signed by:   Shonna Chock CMA on 01/06/2011   Method used:   Print then Give to Patient   RxID:   [id number] CENTRUM SILVER  TABS (MULTIPLE VITAMINS-MINERALS) 1 by mouth once daily  #58mo x prn   Entered by:   Shonna Chock CMA   Authorized by:   Marga Melnick MD   Signed by:   Shonna Chock CMA on 01/06/2011   Method used:   Print then Give to Patient   RxID:   [id number] CALCIUM 600 MG TABS (CALCIUM) 1 by mouth two times a day  #1mo x prm   Entered by:   Shonna Chock CMA   Authorized by:   Marga Melnick MD   Signed by:   Shonna Chock CMA on 01/06/2011   Method used:   Print then Give to Patient   RxID:   [id number]  I spoke with patient and apologized that I didnt call her, I was under the impression that [facility] CMA  January 06, 2011 11:02 AM

## 2011-02-16 ENCOUNTER — Other Ambulatory Visit (HOSPITAL_COMMUNITY): Payer: Self-pay | Admitting: Surgery

## 2011-02-16 DIAGNOSIS — E049 Nontoxic goiter, unspecified: Secondary | ICD-10-CM

## 2011-03-28 ENCOUNTER — Ambulatory Visit (HOSPITAL_COMMUNITY)
Admission: RE | Admit: 2011-03-28 | Discharge: 2011-03-28 | Disposition: A | Discharge status: Home / Self Care | Payer: Medicare Other | Source: Ambulatory Visit | Attending: Surgery | Admitting: Surgery

## 2011-03-28 DIAGNOSIS — E049 Nontoxic goiter, unspecified: Secondary | ICD-10-CM

## 2011-03-28 DIAGNOSIS — E042 Nontoxic multinodular goiter: Secondary | ICD-10-CM | POA: Insufficient documentation

## 2011-03-30 ENCOUNTER — Encounter: Payer: Self-pay | Admitting: Internal Medicine

## 2011-03-31 ENCOUNTER — Ambulatory Visit (INDEPENDENT_AMBULATORY_CARE_PROVIDER_SITE_OTHER): Payer: Medicare Other | Admitting: Internal Medicine

## 2011-03-31 ENCOUNTER — Encounter: Payer: Self-pay | Admitting: Internal Medicine

## 2011-03-31 DIAGNOSIS — IMO0001 Reserved for inherently not codable concepts without codable children: Secondary | ICD-10-CM

## 2011-03-31 DIAGNOSIS — M810 Age-related osteoporosis without current pathological fracture: Secondary | ICD-10-CM

## 2011-03-31 DIAGNOSIS — R32 Unspecified urinary incontinence: Secondary | ICD-10-CM

## 2011-03-31 DIAGNOSIS — R350 Frequency of micturition: Secondary | ICD-10-CM

## 2011-03-31 MED ORDER — OXYBUTYNIN CHLORIDE 5 MG PO TABS: 5.0000 mg | ORAL_TABLET | Freq: Two times a day (BID) | ORAL | Status: DC

## 2011-03-31 NOTE — Progress Notes (Signed)
  Subjective:    Patient ID: Ana Fisher, female    DOB: 08-Sep-1923, 77 y.o.   MRN: [id number]  HPI  Frequency  Worsening: ? Despite Oxybutynin ER 5 daily Symptoms Urgency: yes, occasionally  Hesitancy: no  Hematuria: no  Flank Pain: no  Fever: no    Nausea/Vomiting: no  Discharge: no Rash: no  Red Flags  : (Risk Factors for Complicated UTI) Recent Antibiotic Usage (last 30 days): no  More than 3 UTI's last 12 months: no  PMH of  1. DM: no 2. Renal Disease/Calculi: no 3. Urinary Tract Abnormality: no  4. Instrumentation/Trauma: no 5. Immunosuppression: no      Review of Systems     Objective:   Physical Exam Heart:  Normal rate and regular rhythm. S1 and S2 normal without gallop, murmur, click, rub or other extra sounds.Lungs:Chest clear to auscultation; no wheezes, rhonchi,rales ,or rubs present.No increased work of breathing. Abdomen: bowel sounds normal, soft and non-tender without masses, organomegaly or hernias noted.  No guarding or rebound . No bladder distention. No flank tenderness        Assessment & Plan:  #1 frequency  # 2 Osteoporosis                                                                                                                    Plan:#1  increase Oxybutynin # 2 vit D level

## 2011-03-31 NOTE — Patient Instructions (Signed)
Please take the Tramadol 50 mg every 6 hours as needed for arthritis pain. Stop Centrum Silver

## 2011-04-01 LAB — VITAMIN D 25 HYDROXY (VIT D DEFICIENCY, FRACTURES): Vit D, 25-Hydroxy: 65 ng/mL (ref 30–89)

## 2011-04-11 NOTE — Consult Note (Signed)
NAMESHEREDA, GRAW NO.:  [id number]   MEDICAL RECORD NO.:  [id number]          PATIENT TYPE:  OBV   LOCATION:  3705                         FACILITY:  [facility]   PHYSICIAN:  Lyn Records, M.D.   DATE OF BIRTH:  [birth year]   DATE OF CONSULTATION:  07/14/2008  DATE OF DISCHARGE:  07/14/2008                                 CONSULTATION   REASON FOR CONSULTATION:  Chest discomfort.   CONCLUSIONS:  1. Chest pressure lasting less than 5 minutes on July 13, 2008.      Myocardial infarction has been ruled out by EKG and markers.  2. Abnormal resting EKG with left anterior hemiblock subsequently      causing poor R-wave progression.  3. Gastroesophageal reflux disease.   RECOMMENDATIONS:  1. Ambulate in hall today.  2. If no recurrent chest discomfort, it will be okay to discharge the      patient later this p.m.  We will schedule the patient for an      outpatient pharmacologic Myoview study.  3. Check results of abdominal ultrasound prior to discharge.  4. The patient should return if chest discomfort.   COMMENTS:  [name] is an 77 year old who has multiple medical  problems including orthopedic difficulties that prevent aggressive  physical activity.  While sitting, yesterday here at [facility], she developed a retrosternal rather localized region of chest  pressure lasting less than 5 minutes.  The discomfort gradually  subsided.  There was no radiation of the discomfort, diaphoresis, or  dyspnea.  She has had no recurrence since this relatively brief episode.  She has been having indigestion at night when she lays down.  It is in  the same location as this discomfort occurred today, however, the  discomfort is very mild and has more of a burning quality.  The  discomfort can last up to an hour.  She has never had any exertion  related chest discomfort.   MEDICATIONS ON ADMISSION:  1. Aspirin 81 mg per day.  2. Calcium with vitamin D  daily.  3. Hydrochlorothiazide 12.5 mg per day.  4. Tramadol 50 mg 1-2 tablets every 8 hours.  5. VESIcare 5 mg daily.   ALLERGIES:  PENICILLIN and SULFA.   HABITS:  Does not smoke, discontinued 26 years ago.  Denies ethanol  consumption.   SIGNIFICANT MEDICAL PROBLEMS:  1. Left greater than right peripheral edema secondary to venous      insufficiency.  2. Appendectomy.  3. Colon polypectomy.  4. Peptic ulcer disease.  5. Hysterectomy.  6. Mastectomy in 1980s, bilaterally for breast cancer.  7. Tonsillectomy.  8. Total knee replacement.  9. Bilateral cataracts.   FAMILY HISTORY:  Positive for cancer.  Her brother has recently died  from abdominal aortic aneurysm.   PHYSICAL EXAMINATION:  GENERAL:  The patient is in no distress.  VITAL SIGNS:  Her blood pressure is 160/80 and heart rate is 80.  HEENT:  Unremarkable.  NECK:  No JVD or carotid bruits.  LUNGS:  Clear to auscultation and percussion.  CARDIAC:  No murmur, no rub,  no click, or no gallop.  EXTREMITIES:  No edema.  Varicose veins are noted.  NEUROLOGIC:  Intact.   EKG reveals left anterior hemiblock and first-degree AV block.  Poor R-  wave progression is noted likely secondary to the left anterior  hemiblock.  Chest x-ray reveals COPD.  No focal abnormality.  BUN and  creatinine are 10 and 0.44 and potassium 3.8.  Four sets of markers (2  point-of-care, and 2 in lab determinations) are normal.  The EKG did not  reveal any evolutionary changes.  Hemoglobin is 13.6 with a platelet  count of 173,000.  Abdominal ultrasound is pending.   DISCUSSION:  The patient had very brief episodes of discomfort.  Qualitatively, it is possible this could have been angina.  She has not  had a myocardial infarction.  EKG is abnormal, appears to have a  conduction abnormality.  No acute ST-T wave changes noted.  We will plan  for an expeditious outpatient nuclear study.  If the patient has  recurrent symptoms, more invasive  evaluation would be indicated.  We  will contact her with the time for the Cardiolite to be done within the  next 5 days, if she is discharged today.      Lyn Records, M.D.  Electronically Signed     HWS/MEDQ  D:  07/14/2008  T:  07/15/2008  Job:  [id number]   cc:   Titus Dubin. Alwyn Ren, MD,FACP,FCCP

## 2011-04-14 NOTE — Discharge Summary (Signed)
[name], [name]                      ACCOUNT NO.:  [id number]   MEDICAL RECORD NO.:  [id number]                   PATIENT TYPE:  IPS   LOCATION:  4145                                 FACILITY:  [facility]   PHYSICIAN:  Erick Colace, M.D.           DATE OF BIRTH:  [birth year]   DATE OF ADMISSION:  07/08/2003  DATE OF DISCHARGE:  07/16/2003                                 DISCHARGE SUMMARY   DISCHARGE DIAGNOSES:  1. Right total knee replacement.  2. Postoperative anemia.   HISTORY OF PRESENT ILLNESS:  [name] is a 77 year old female with  history of breast cancer, end-stage OA right knee who elected to undergo  right total knee replacement July 06, 2003, by Dr. Lequita Halt.  Postoperatively, the patient is weightbearing as tolerated, on Coumadin for  DVT prophylaxis.  She has had some problems with constipation, nausea past  surgery, noted to have some drop in H&H to 10.4 and 30.8.  Physical therapy  initiated and the patient had minimal assist for bed mobility, minimal to  moderate assist for transfers.  Minimal assist to ambulate 10 feet x2 with  pain and difficulty with knee flexion.  CIR considered for progressive  goals.   ALLERGIES:  1. PENICILLIN.  2. SULFA.   PAST MEDICAL HISTORY:  1. Breast cancer, status post bilateral mastectomies with implants.  2. Hysterectomy.  3. Osteopenia.  4. PUD with rupture and repair.  5. Macular degeneration.  6. GERD.  7. Insomnia.   SOCIAL HISTORY:  The patient lives alone in one level home with two steps at  entry.  She was independent working part time at [employer] prior to admission.  She does not use tobacco.  She drinks a glass  of wine daily.   HOSPITAL COURSE:  [name] was admitted to rehab on July 08, 2003, for inpatient therapies to consist of PT and OT daily.  Past  admission, Skelaxin was added for muscle spasms as well as ice as the  patient was with complaints of  pain control.  She continued to have problems  with pain control, however, has been unable to tolerate oxycodone secondary  to some complaints of dizziness.  This was discontinued and the patient's  Celebrex was resumed and increase in Ultram and Tylenol is currently being  used for pain control without sedative side effects.  The patient's knee  incision has been healing well without any signs or symptoms of infection.  No drainage, no erythema noted.  The patient was maintained on Coumadin  throughout her stay.   Past admission, labs done showed the patient to have postoperative anemia  with H&H at 8.2 and 24.2.  Serial follow-up of CBC shows this to be  improving with last check showing hemoglobin at 8.7 and hematocrit 25.5.  Check of lytes of July 13, 2003, shows sodium 136, potassium 4.4, chloride  103, CO2 28, BUN  8, creatinine 0.5, glucose 100.  UA/UC done showed 10,000  colonies E. coli.  The patient was treated with three-day course of Cipro  for this.   During her stay in rehab, [name] progressed well. By the time of  discharge, she was modified independent for transfers, modified independent  for ambulating 160 feet x3.  She was able to navigate four steps with  supervision and hand-held assist.  In terms of ADLs, the patient was  modified independent for upper and lower body care including toileting and  simple knee preparations.  The patient to continue to receive follow-up home  health PT and OT by [facility] to  follow Coumadin past discharge.   On July 16, 2003, the patient is discharged to home.   DISCHARGE MEDICATIONS:  1. Coumadin 2 mg one and a half p.o. per day.  2. Zantac 150 mg a day.  3. Skelaxin 400 mg q.i.d.  4. Trinsicon one p.o. b.i.d.  5. Celebrex 200 mg b.i.d.  6. Senokot S one p.o. b.i.d.  7. Ambien 5 mg p.o. q.h.s. p.r.n.  8. Ultram 100 mg p.o. q.i.d. p.r.n.   DIET:  Regular.   WOUND CARE:  Keep  area clean and dry.   DISCHARGE INSTRUCTIONS:  No alcohol, no smoking, no driving.  Progressive PT  and OT to continue past discharge with next protime to be drawn on July 17, 2003.   FOLLOW UP:  The patient is to follow up with Dr. Lequita Halt for postoperative  check, to follow up with Dr. Alwyn Ren for routine check, to follow up with Dr.  Erick Colace as needed.      Greg Cutter, P.A.                    Erick Colace, M.D.    PP/MEDQ  D:  07/15/2003  T:  07/16/2003  Job:  [id number]   cc:   Titus Dubin. Alwyn Ren, M.D. [facility]   Ollen Gross, M.D.  [address]  [place]  [place] [address]  Fax: [phone]

## 2011-04-14 NOTE — Op Note (Signed)
NAME:  JEVAEH, SHAMS                      ACCOUNT NO.:  [id number]   MEDICAL RECORD NO.:  [id number]                   PATIENT TYPE:  INP   LOCATION:  NA                                   FACILITY:  [facility]   PHYSICIAN:  Ollen Gross, M.D.                 DATE OF BIRTH:  [birth year]   DATE OF PROCEDURE:  07/06/2003  DATE OF DISCHARGE:                                 OPERATIVE REPORT   PREOPERATIVE DIAGNOSIS:  Osteoarthritis of right knee.   POSTOPERATIVE DIAGNOSIS:  Osteoarthritis of right knee.   OPERATION PERFORMED:  Right total knee arthroplasty.   SURGEON:  Ollen Gross, M.D.   ASSISTANT:  Alexzandrew L. Julien Girt, P.A.   ANESTHESIA:  Spinal.   ESTIMATED BLOOD LOSS:  Minimal.   DRAINS:  Hemovac times one.   COMPLICATIONS:  None.   TOURNIQUET TIME:  41 minutes at .   CONDITION:  Stable to recovery.   INDICATIONS FOR PROCEDURE:  Ms. Gershon Mussel is a 77 year old female with end  stage valgus arthritis of her right knee.  She has failed nonoperative  management and presents for total knee arthroplasty.   DESCRIPTION OF PROCEDURE:  After successful administration of spinal  anesthetic, a tourniquet was placed high on the right thigh and right lower  extremity prepped and draped in the usual sterile fashion.  Extremity was  wrapped with an Esmarch, knee flexed and tourniquet inflated to .  Standard midline incision was made with a 10 blade through subcutaneous  tissue to the level of the extensor mechanism.  Essentially, she had a  valgus knee went from about 15 to 20 degrees of valgus.  I did a lateral  parapatellar arthrotomy.  The soft tissue over the proximal lateral tibia,  subperiosteally elevated to the joint line with a knife.  We did not elevate  the tissue off of the medial tibia.  The patella was then everted medially,  knee flexed 90 degrees and ACL and  PCL removed.  Drill was then used to  create a starting hole in the distal femur and the  canal was irrigated.  A 5  degree left valgus and alignment guide was placed referencing off the  posterior condyle.  Rotation is marked and a blocked impingement of 10 mm  off the distal femur.  Distal femoral resection was then made with an  oscillating saw.   Sizing block was placed and a size 3 was the most appropriate.  If I  referenced off the posterior condyle, it would have internally rotated.  Thus I referenced off the epicondylar axis to gain the appropriate amount of  external rotation.  The size 3 cutting block was placed by using the  epicondylar axis for rotation and then the anterior and posterior cuts made.   The tibial subluxed forward and the menisci removed.  Extramedullary tibial  alignment guides placed referencing proximally at the medial aspect of the  tibial tubercle and distally on the second metatarsal axis of the tibial  crest.  Block was pinned to removed approximately 4 mm of the deficient  lateral side.  Tibial resection was made with an oscillating saw.  The size  3 tibia was most appropriate and the proximal tibia was subsequently  prepared with a modular drill and keel punch.  The femoral preparation was  then completed with the intercondylar and chamfer cuts.   The 10 mm posterior stabilized rotating insert trial was placed and full  extension was achieved with excellent varus and valgus balance throughout.  We then did the patellar preparation.  The thickness was measured to be 20  mm, Freer resection and taken to 12 mm, 35 template placed, lug hole was  drilled, trial patella placed and tracks normally.  Osteophytes were then  removed off the posterior femur trial placed.  All trials were then removed  and the cut bone surfaces prepared with pulsatile lavage.  Cement mixed and  once ready for  implantation, size 3 molded bearing tibial tray, size 3  posterior stabilized femur and 35 patella were cemented in place.  Patella  was held with a clamp.   Once the  cement was fully hardened, then all  extruded cement was removed and the permanent 10 mm posterior stabilizer  rotating platform insert was placed into the tibial tray.  The tourniquet  was then released with a total time of 41 minutes and minor bleeding stopped  with cautery.  Fat pads closed and then the arthrotomy closed with  interrupted #1 PDS.  We left open the lateral arthrotomy from the area of  the superior to inferior pole of the patella to effectively serve as lateral  release.  The subcutaneous tissues were then closed with interrupted 2-0  Vicryl.  Flexion against gravity was 135 degrees.  A Hemovac drain had been  placed.  Subcuticular was closed with running 4-0 Monocryl.  Incision  cleaned and dried and Steri-Strips and bulky sterile dressing applied.  She  was then awakened and transported to recovery in stable condition.                                               Ollen Gross, M.D.    FA/MEDQ  D:  07/06/2003  T:  07/06/2003  Job:  [id number]

## 2011-04-14 NOTE — Discharge Summary (Signed)
NAMELEVONNE, CARRERAS             ACCOUNT NO.:  [id number]   MEDICAL RECORD NO.:  [id number]          PATIENT TYPE:  OBV   LOCATION:  3705                         FACILITY:  [facility]   PHYSICIAN:  Valerie A. Felicity Coyer, MDDATE OF BIRTH:  [birth year]   DATE OF ADMISSION:  07/13/2008  DATE OF DISCHARGE:  07/14/2008                               DISCHARGE SUMMARY   DISCHARGE DIAGNOSES:  Chest pain with dizziness and diaphoresis of  unclear etiology.   HISTORY OF PRESENT ILLNESS:  Ms. Schreffler is an 77 year old white female  with past medical history of GERD and multiple orthopedic difficulties  who presented to [facility] Emergency Department on day of admission  with reports of localized retrosternal chest pain lasting approximately  5 minutes and gradually subsiding with no intervention.  The patient  denied any radiation of pain, however, did endorse some dizziness with  episode of pain.  The patient described that she had been having  indigestion at night when she lays down occurring in the same location  as discomfort on day of admission.  Due to the patient's symptoms and  the patient's risk factors, the patient was admitted to rule out cardiac  etiology of chest pain.   PAST MEDICAL HISTORY:  1. Chronic venous insufficiency, left greater than right, peripheral      edema.  2. Appendectomy.  3. Colon polypectomy.  4. Peptic ulcer disease.  5. Hysterectomy.  6. Mastectomy in the 1980s, secondary to breast cancer.  7. Tonsillectomy.  8. Total knee replacement.  9. Bilateral cataracts.  10.Remote tobacco abuse history, discontinuing approximately 26 years      prior to this admission.   CONSULTATIONS DURING THIS ADMISSION:  [facility], Dr. Verdis Prime.   COURSE OF HOSPITALIZATION:  Chest pain with diaphoresis.  Symptoms as  mentioned in HPI above.  The patient with no recurrent episodes during  hospitalization.  Cardiac enzymes were negative x3.  EKG revealed normal  sinus  rhythm with anterior infarct of questionable age.  The patient was  continued on aspirin and beta-blocker therapy.  Dr. Katrinka Blazing was asked to  see the patient in consultation to determine further workup.  The  patient was ambulated in hallways without any recurrent chest pain,  therefore I felt medically stable for discharge home with outpatient  Cardiolite arranged by [facility].  Again, unclear at this point  whether or not, the patient's symptoms due to angina versus GI source,  though workup has revealed the patient has not had a myocardial  infarction.   MEDICATIONS AT TIME OF DISCHARGE:  1. Aspirin 81 mg daily.  2. Vesicare 5 mg p.o. daily.  3. Vitamin D 1000 mg p.o. daily.  4. Multivitamin 1 tablet p.o. daily.  5. Hydrochlorothiazide 12.5 mg p.o. daily.   PERTINENT LABORATORY WORK AT TIME OF DISCHARGE:  White cell count 7.6,  platelet count 173, hemoglobin 13.6, and hematocrit 40.7.  Sodium 135,  potassium 3.8, BUN 10, creatinine 0.44, and INR 1.0.  Cardiac enzymes  negative x3.  Chest x-ray with no acute findings revealing COPD.   DISPOSITION:  The patient felt medically  stable for discharge home.  The  [facility] office will call the patient to inform her date and  time of Cardiolite stress test.      Cordelia Pen, NP      Raenette Rover. Felicity Coyer, MD  Electronically Signed    LE/MEDQ  D:  10/14/2008  T:  10/15/2008  Job:  [id number]   cc:   Titus Dubin. Alwyn Ren, MD,FACP,FCCP  Lyn Records, M.D.

## 2011-04-14 NOTE — H&P (Signed)
NAME:  Ana Fisher, Ana Fisher                      ACCOUNT NO.:  [id number]   MEDICAL RECORD NO.:  [id number]                   PATIENT TYPE:  INP   LOCATION:  0474                                 FACILITY:  [facility]   PHYSICIAN:  Ollen Gross, M.D.                 DATE OF BIRTH:  [birth year]   DATE OF ADMISSION:  07/06/2003  DATE OF DISCHARGE:  07/08/2003                                HISTORY & PHYSICAL   CHIEF COMPLAINT:  Right knee pain.   HISTORY OF PRESENT ILLNESS:  The patient is a 77 year old female with a long-  standing history of right knee pain which has progressively gotten worse  over the past year or so.  She was seen in the office by Dr. Lequita Halt and  found to have end-stage arthritis of the right knee.  The patient states the  pain is to the point where it is hurting all the time.  She did have a fall  back earlier this year, which seemed to inflame the knee, and has had  progressive knee pain since that time, starting to interfere with her  recreational activities, and also having difficulty with her ADL's.  X-rays  show end-stage changes.  It is felt she would benefit from undergoing total  knee replacement.  Risks and benefits discussed.  The patient was  subsequently admitted to the hospital.   ALLERGIES:  1. PENICILLIN causes hives.  2. SULFA; she does not recall what type of reactive.   CURRENT MEDICATIONS:  1. She is on a women's ultra-mega vitamin.  2. Glucosamine.  3. Chondroitin.  4. Calcium.  5. Celebrex.  6. Vitamin E.  7. Evista.  8. Furosemide.   PAST MEDICAL HISTORY:  1. History of H. pylori positive peptic ulcers.  2. History of a ruptured peptic ulcer in the past.  3. Osteoporosis.   PAST SURGICAL HISTORY:  1. Tonsillectomy and adenoidectomy as a child.  2. Benign cyst, right breast, November of 1947.  3. Repair of ruptured peptic ulcer in 1958.  4. A D&C x2.  5. Right mastectomy in 1982 with subsequent reconstruction.  6. Left mastectomy  in 1983 with subsequent reconstruction.  7. Muscle flap surgery, left breast, in 1983.  8. Hammertoe surgery, right foot, in 1998.  9. Bilateral cataract surgeries.  10.      External fixation of a left wrist fracture in 1994.  11.      Colonoscopy, which later proved to be an H. pylori positive scope.   SOCIAL HISTORY:  Single.  Past smoker.  Quit smoking in 1981.  Occasional  intake of alcohol; a glass of wine with dinner.  She works part-time at  [employer].   FAMILY HISTORY:  Father deceased at age 58 with lung cancer.  Mother  deceased at age 59 with urinary poisoning.   REVIEW OF SYSTEMS:  GENERAL:  She did have a little bit of fever last  week  associated with congestion and a cold.  No chills or night sweats.  NEUROLOGIC:  No seizures, syncope, paralysis.  RESPIRATORY:  Cough with  congestion-type cold.  No shortness of breath or hemoptysis.  CARDIOVASCULAR:  No chest pain, angina, orthopnea.  GI:  No nausea,  vomiting, diarrhea, constipation.  GU:  No dysuria, hematuria, or  discharged.  MUSCULOSKELETAL:  Pertinent to that found in the history of  present illness concerning the right knee.   PHYSICAL EXAMINATION:  VITAL SIGNS:  Pulse 68, respirations 14, blood  pressure 130/82.  GENERAL:  A 77 year old white female, well-nourished, well-developed, in no  acute distress.  Alert, oriented, and cooperative.  HEENT:  Normocephalic and atraumatic.  Pupils round and reactive.  Extraocular movements intact.  NECK:  Supple.  There is a faint right bruit noted.  No bruit on the left.  CHEST:  Clear to auscultation anterior and posterior chest walls.  No  rhonchi or rales.  HEART:  Regular rate and rhythm.  No murmur.  S1 and S2 noted.  ABDOMEN:  Soft, slightly round, nontender.  Bowel sounds present.  RECTAL/BREASTS/GENITALIA:  Not done.  Not pertinent to present illness.  EXTREMITIES:  Limited to the right lower extremity.  Range of motion lacking  10 degrees of full extension  with 110 degrees of flexion.  Slight varus  deformity.  Motor function intact.  Pulses noted to be intact.   IMPRESSION:  1. Osteoarthritis, right knee.  2. History of breast cancer.  3. History of peptic ulcer disease.  4. Prior H. pylori positive ulcers.  5. Osteoporosis.   PLAN:  The patient will be admitted to [facility] to undergo right  total knee replacement arthroplasty.  The surgery will be performed by Dr.  Ollen Gross.     Alexzandrew L. Julien Girt, P.A.              Ollen Gross, M.D.    ALP/MEDQ  D:  07/17/2003  T:  07/18/2003  Job:  [id number]

## 2011-08-08 ENCOUNTER — Ambulatory Visit (INDEPENDENT_AMBULATORY_CARE_PROVIDER_SITE_OTHER): Payer: Medicare Other | Admitting: Internal Medicine

## 2011-08-08 ENCOUNTER — Encounter: Payer: Self-pay | Admitting: Internal Medicine

## 2011-08-08 DIAGNOSIS — H538 Other visual disturbances: Secondary | ICD-10-CM

## 2011-08-08 DIAGNOSIS — R634 Abnormal weight loss: Secondary | ICD-10-CM

## 2011-08-08 DIAGNOSIS — R29818 Other symptoms and signs involving the nervous system: Secondary | ICD-10-CM

## 2011-08-08 NOTE — Patient Instructions (Addendum)
Please see the ophthalmologist to assess the visual changes. Share this report & labs with all doctors seen. Complete stool cards.. Stop the oxybutynin in the morning and assess the  voice issues.

## 2011-08-08 NOTE — Progress Notes (Signed)
Subjective:    Patient ID: Ana Fisher, female    DOB: September 01, 1923, 77 y.o.   MRN: [id number]  HPI Balance Issues:"weaving & wobbling" with  Ambulation . No definite retropulsion Onset:. 12 months ago but progressive steadily Context: only with walking Position change:no Benign positional vertigo symptoms:no Straining:no Pain:no Cardiac prodrome:no palpitations, irregular rhythm, heart rate change Neurologic prodrome: no  headache, numbness and tingling, weakness. Syncope:no Seizure activity:no Duration:resolution as soon as she sits Frequency:whenever she walks Associated signs and symptoms: Visual change (blurred/double/loss):no, but vision blurred constantly.No diplopia. Last Ophth exam < 9 mos ago; new glasses Rxed with some improvement initially ,but continued blurring since Hearing loss/tinnitus:no Nausea/sweating:no Treatment/response:slowing gait      Review of Systems She has had some hoarseness and also difficulty swallowing at times. Dr. Gerrit Friends stated her thyroid status is stable and she is not need followup for 24 months.  She denies anorexia, abdominal pain, rectal bleeding, or melena. Weight is down 5 # over 2 months .  Her major concern in his voice alteration as she takes oxybutynin and furosemide in the morning.       Objective:   Physical Exam Gen.:Thin; in no acute distress. Alert, appropriate and cooperative throughout exam. Head: Normocephalic without obvious abnormalities Eyes: No corneal or conjunctival inflammation noted. Pupils equal round reactive to light and accommodation.  Extraocular motion intact. Vision grossly normal with lenses. Ears: External  ear exam reveals no significant lesions or deformities. Canals clear .TMs normal. Hearing is grossly normal bilaterally. Nose: External nasal exam reveals no deformity or inflammation. Nasal mucosa are pink and moist. No lesions or exudates noted.   Mouth: Oral mucosa and oropharynx reveal no  lesions or exudates. Teeth in good repair. Neck: No deformities, masses, or tenderness noted. Range of motion &  Thyroid small. Lungs: Normal respiratory effort; chest expands symmetrically. Lungs are clear to auscultation without rales, wheezes, or increased work of breathing. Heart: Normal rate and rhythm. Normal S1 and S2. No gallop, click, or rub. S4 w/o  murmur. Abdomen: Bowel sounds normal; abdomen soft and nontender. No masses, organomegaly or hernias noted.                                                             Musculoskeletal/extremities:  Lordosis noted of  the thoracic spine. No clubbing, cyanosis or deformity noted. Tone & strength  Normal w/o cogwheeling..Joints normal. Nail health  Good. Marked lipidema of feet. Varicose veins. Vascular: Carotid, radial artery, dorsalis pedis and  posterior tibial pulses are full and equal. No bruits present. Neurologic: Alert and oriented x3. Deep tendon reflexes symmetrical and normal. Her gait is slow and choppy; there is decrease arm swing. She charges slowly. She does exhibit some decreased facial expression but there is no definite cranial nerve deficit.      Skin: Intact without suspicious lesions or rashes. Lymph: No cervical, axillary lymphadenopathy present. Psych: Mood and affect are normal. Normally interactive  Assessment & Plan:  #1 gait dysfunction  #2 change in vision since exam earlier this year  #3 hoarseness despite a history of stable thyroid function as per her surgeon. A late historical item is that  hoarseness appears to occur 1.5 hrs after  her taking oxybutynin and furosemide.  #4 some swallowing dysfunction  #5 weight loss, slight  Plan: #1 neurologic assessment is essential to rule out a process such as Parkinson's in view of the gait dysfunction being the primary issue  #2 visual dysfunction; ophthalmologic reevaluation  indicated  #3 see labs and orders to evaluate the weight loss. Thyroid function tests will not be done as these were recently done by her surgeon.  #4 swallowing dysfunction. Absence of diplopia mitigates  against myasthenia. If symptoms persist GI evaluation is indicated.

## 2011-08-09 LAB — CBC WITH DIFFERENTIAL/PLATELET
Basophils Relative: 0.4 % (ref 0.0–3.0)
Hemoglobin: 13.3 g/dL (ref 12.0–15.0)
Lymphocytes Relative: 46.6 % — ABNORMAL HIGH (ref 12.0–46.0)
MCHC: 32.8 g/dL (ref 30.0–36.0)
Monocytes Relative: 6.5 % (ref 3.0–12.0)
Neutro Abs: 3.2 10*3/uL (ref 1.4–7.7)
RBC: 4.34 Mil/uL (ref 3.87–5.11)

## 2011-08-09 LAB — BASIC METABOLIC PANEL
CO2: 33 mEq/L — ABNORMAL HIGH (ref 19–32)
Calcium: 9.6 mg/dL (ref 8.4–10.5)
GFR: 113.71 mL/min (ref >60.00)
Sodium: 137 mEq/L (ref 135–145)

## 2011-08-09 LAB — HEPATIC FUNCTION PANEL
ALT: 19 U/L (ref 0–35)
AST: 31 U/L (ref 0–37)
Albumin: 4 g/dL (ref 3.5–5.2)
Alkaline Phosphatase: 54 U/L (ref 39–117)
Total Protein: 6.9 g/dL (ref 6.0–8.3)

## 2011-08-16 ENCOUNTER — Other Ambulatory Visit (INDEPENDENT_AMBULATORY_CARE_PROVIDER_SITE_OTHER): Payer: Medicare Other

## 2011-08-16 DIAGNOSIS — Z1211 Encounter for screening for malignant neoplasm of colon: Secondary | ICD-10-CM

## 2011-08-16 NOTE — Progress Notes (Signed)
Labs only

## 2011-08-23 ENCOUNTER — Telehealth: Payer: Self-pay | Admitting: Internal Medicine

## 2011-08-23 DIAGNOSIS — R32 Unspecified urinary incontinence: Secondary | ICD-10-CM

## 2011-08-23 NOTE — Telephone Encounter (Signed)
Cell [phone] ---- patient stopped oxybutnin for a few days but went back on it - she wants to know if she could change to another med - she discussed this with dr hopper ov [phone] -  ins will lpay for flavoxate hcl ----trostium chloride tab -- [facility] - [address]

## 2011-08-23 NOTE — Telephone Encounter (Signed)
Discuss with patient, referral put in.  

## 2011-08-23 NOTE — Telephone Encounter (Signed)
Because of the potential adverse effects with the  bladder drugs, I recommended she be reevaluated by Urology as these  agents may  have central nervous system affects which might impact  balance and coordination. The specialty  evaluation would  determine which  agent would be optimal.

## 2011-11-09 ENCOUNTER — Other Ambulatory Visit (HOSPITAL_COMMUNITY): Payer: Self-pay | Admitting: Diagnostic Neuroimaging

## 2011-11-09 DIAGNOSIS — R269 Unspecified abnormalities of gait and mobility: Secondary | ICD-10-CM

## 2011-11-10 ENCOUNTER — Ambulatory Visit (HOSPITAL_COMMUNITY)
Admission: RE | Admit: 2011-11-10 | Discharge: 2011-11-10 | Disposition: A | Discharge status: Home / Self Care | Payer: Medicare Other | Source: Ambulatory Visit | Attending: Diagnostic Neuroimaging | Admitting: Diagnostic Neuroimaging

## 2011-11-10 DIAGNOSIS — G319 Degenerative disease of nervous system, unspecified: Secondary | ICD-10-CM | POA: Insufficient documentation

## 2011-11-10 DIAGNOSIS — R269 Unspecified abnormalities of gait and mobility: Secondary | ICD-10-CM | POA: Insufficient documentation

## 2011-12-05 ENCOUNTER — Other Ambulatory Visit: Payer: Self-pay | Admitting: Internal Medicine

## 2012-01-10 ENCOUNTER — Emergency Department (HOSPITAL_COMMUNITY): Payer: PRIVATE HEALTH INSURANCE

## 2012-01-10 ENCOUNTER — Encounter (HOSPITAL_COMMUNITY): Payer: Self-pay | Admitting: *Deleted

## 2012-01-10 ENCOUNTER — Emergency Department (HOSPITAL_COMMUNITY)
Admission: EM | Admit: 2012-01-10 | Discharge: 2012-01-10 | Disposition: A | Discharge status: Home / Self Care | Payer: PRIVATE HEALTH INSURANCE | Attending: Emergency Medicine | Admitting: Emergency Medicine

## 2012-01-10 DIAGNOSIS — Z853 Personal history of malignant neoplasm of breast: Secondary | ICD-10-CM | POA: Insufficient documentation

## 2012-01-10 DIAGNOSIS — M810 Age-related osteoporosis without current pathological fracture: Secondary | ICD-10-CM | POA: Insufficient documentation

## 2012-01-10 DIAGNOSIS — S5002XA Contusion of left elbow, initial encounter: Secondary | ICD-10-CM

## 2012-01-10 DIAGNOSIS — S5000XA Contusion of unspecified elbow, initial encounter: Secondary | ICD-10-CM | POA: Insufficient documentation

## 2012-01-10 DIAGNOSIS — R296 Repeated falls: Secondary | ICD-10-CM | POA: Insufficient documentation

## 2012-01-10 DIAGNOSIS — W19XXXA Unspecified fall, initial encounter: Secondary | ICD-10-CM

## 2012-01-10 DIAGNOSIS — M79609 Pain in unspecified limb: Secondary | ICD-10-CM | POA: Insufficient documentation

## 2012-01-10 DIAGNOSIS — W230XXA Caught, crushed, jammed, or pinched between moving objects, initial encounter: Secondary | ICD-10-CM | POA: Insufficient documentation

## 2012-01-10 DIAGNOSIS — M25529 Pain in unspecified elbow: Secondary | ICD-10-CM | POA: Insufficient documentation

## 2012-01-10 MED ORDER — ACETAMINOPHEN 325 MG PO TABS
650.0000 mg | ORAL_TABLET | Freq: Once | ORAL | Status: AC
  Administered 2012-01-10: 650 mg via ORAL
  Filled 2012-01-10: qty 2

## 2012-01-10 MED ORDER — TRAMADOL HCL 50 MG PO TABS: 50.0000 mg | ORAL_TABLET | Freq: Four times a day (QID) | ORAL | Status: AC | PRN

## 2012-01-10 MED ORDER — TRAMADOL HCL 50 MG PO TABS: 50.0000 mg | ORAL_TABLET | Freq: Four times a day (QID) | ORAL | Status: DC | PRN

## 2012-01-10 NOTE — Progress Notes (Signed)
Orthopedic Tech Progress Note Patient Details:  Ana Fisher September 28, 1923 [id number]  Other Ortho Devices Type of Ortho Device: Other (comment) Ortho Device Location: left arm Ortho Device Interventions: Application   Gaye Pollack 01/10/2012, 10:19 AM

## 2012-01-10 NOTE — ED Provider Notes (Signed)
Medical screening examination/treatment/procedure(s) were conducted as a shared visit with non-physician practitioner(s) and myself.  I personally evaluated the patient during the encounter Patient with a mechanical fall and left elbow pain. X-ray with a small effusion but no acute bony abnormalities. Patient was able to ambulate without difficult  Ana Sprout, MD 01/10/12 1601

## 2012-01-10 NOTE — ED Notes (Signed)
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  Pt. Has pain to the left elbow.  Pt.is unable to extend elbow and has c/o pain with palaption.

## 2012-01-10 NOTE — Discharge Instructions (Signed)
Please followup with your primary care provider at the end of this week if you continue to have pain.  You may return to the ER at any time for worsening condition or any new symptoms that concern you.    Elbow Injury You or your child has an elbow injury. X-rays and exam today do not show evidence of a fracture (broken bone). That means that only a sling or splint may be required for a brief period of time as directed by your caregiver. HOME CARE INSTRUCTIONS  Only take over-the-counter or prescription medicines for pain, discomfort, or fever as directed by your caregiver.   If you have a splint held on with an elastic wrap or a cast, watch your hand or fingers. If they become numb or cold and blue, loosen the wrap and reapply more loosely. See your caregiver if there is no relief.   You may use ice on your elbow for 15 to 20 minutes, 3 to 4 times per day, for the first 2 to 3 days.   Use your elbow as directed.   See your caregiver as directed. It is very important to keep all follow-up referrals and appointments in order to avoid any long-term problems with your elbow including chronic pain or inability to move the elbow normally.  SEEK IMMEDIATE MEDICAL CARE IF:   There is swelling or increasing pain in your elbow which is not relieved with medications.   You begin to lose feeling in your hand or fingers, or develop swelling of the hand and fingers.   You get a cold or blue hand or fingers on injured side.   If your elbow remains sore, your caregiver may want to x-ray it again. A hairline fracture may not show up on the first x-rays and may only be seen on repeat x-rays ten days to two weeks later. A specialist (radiologist) may examine your x-rays at a later time. In order to get results from the radiologist or their department, make sure you know how and when you are to get that information. It is your responsibility to get results of any tests you may have had.  MAKE SURE YOU:    Understand these instructions.   Will watch your condition.   Will get help right away if you are not doing well or get worse.  Document Released: 02/03/2004 Document Revised: 07/26/2011 Document Reviewed: 07/01/2008 [facility] Patient Information 2012 [place], [place].Contusion A bruise (contusion) or hematoma is a collection of blood under skin causing an area of discoloration. It is caused by an injury to blood vessels beneath the injured area with a release of blood into that area. As blood accumulates it is known as a hematoma. This collection of blood causes a blue to dark blue color. As the injury improves over days to weeks it turns to a yellowish color and then usually disappears completely over the same period of time. These generally resolve completely without problems. The hematoma rarely requires drainage. HOME CARE INSTRUCTIONS   Apply ice to the injured area for 15 to 20 minutes 3 to 4 times per day for the first 1 or 2 days.   Put the ice in a plastic bag and place a towel between the bag of ice and your skin. Discontinue the ice if it causes pain.   If bleeding is more than just a little, apply pressure to the area for at least thirty minutes to decrease the amount of bruising. Apply pressure and ice as your  caregiver suggests.   If the injury is on an extremity, elevation of that part may help to decrease pain and swelling. Wrapping with an ace or supportive wrap may also be helpful. If the bruise is on a lower extremity and is painful, crutches may be helpful for a couple days.   If you have been given a tetanus shot because the skin was broken, your arm may get swollen, red and warm to touch at the shot site. This is a normal response to the medicine in the shot. If you did not receive a tetanus shot today because you did not recall when your last one was given, make sure to check with your caregiver's office and determine if one is needed. Generally for a "dirty" wound, you  should receive a tetanus booster if you have not had one in the last five years. If you have a "clean" wound, you should receive a tetanus booster if you have not had one within the last ten years.  SEEK MEDICAL CARE IF:   You have pain not controlled with over the counter medications. Only take over-the-counter or prescription medicines for pain, discomfort, or fever as directed by your caregiver. Do not use aspirin as it may cause bleeding.   You develop increasing pain or swelling in the area of injury.   You develop any problems which seem worse than the problems which brought you in.  SEEK IMMEDIATE MEDICAL CARE IF:   You have a fever.   You develop severe pain in the area of the bruise out of proportion to the initial injury.   The bruised area becomes red, tender, and swollen.  MAKE SURE YOU:   Understand these instructions.   Will watch your condition.   Will get help right away if you are not doing well or get worse.  Document Released: 08/23/2005 Document Revised: 07/26/2011 Document Reviewed: 07/01/2008 [facility] Patient Information 2012 [place], [place].

## 2012-01-10 NOTE — ED Provider Notes (Signed)
History     CSN: [id number]  Arrival date & time 01/10/12  5621   First MD Initiated Contact with Patient 01/10/12 [phone]      Chief Complaint  Patient presents with  . Joint Swelling  . Fall  . Extremity Pain    (Consider location/radiation/quality/duration/timing/severity/associated sxs/prior treatment) HPI Comments: Patient reports she was walking to work, and walking, through the sliding electronic doors.  When her arm, probably got caught between the door is pulling her backwards causing her to fall.  Patient states she fell backwards onto her back and left arm.  She reports pain in her left elbow.  Denies hitting head or loss of consciousness.  Denies any other injury or pain.  Denies weakness or numbness or tingling of her extremities.  Patient is a 78 y.o. female presenting with fall and extremity pain. The history is provided by the patient.  Fall The accident occurred less than 1 hour ago.  Extremity Pain    Past Medical History  Diagnosis Date  . Goiter, nontoxic, multinodular   . Urinary incontinence   . Ankle edema   . Constipation   . Hx of colonic polyps   . Osteoporosis   . Breast cancer     Past Surgical History  Procedure Date  . Appendectomy   . Colonoscopy w/ polypectomy   . Abdominal hysterectomy   . Bilateral salpingoophorectomy   . Mastectomy     bilateral  . Tonsillectomy   . Total knee arthroplasty   . Cataract extraction, bilateral     Family History  Problem Relation Age of Onset  . Cancer Father     lung cancer  . Aortic aneurysm Brother     History  Substance Use Topics  . Smoking status: Former Smoker    Quit date: 11/27/1982  . Smokeless tobacco: Not on file  . Alcohol Use: Yes     wine     OB History    Grav Para Term Preterm Abortions TAB SAB Ect Mult Living                  Review of Systems  All other systems reviewed and are negative.    Allergies  Penicillins and Sulfonamide derivatives  Home  Medications   Current Outpatient Rx  Name Route Sig Dispense Refill  . CALTRATE 600 PO Oral Take by mouth 2 (two) times daily.      Marland Kitchen VITAMIN D 1000 UNITS PO CAPS Oral Take 1,000 Units by mouth daily.      . CENTRUM SILVER PO Oral Take 1 tablet by mouth daily. 5 x weekly. Mon-Fri    . FISH OIL 1200 MG PO CAPS Oral Take by mouth 2 (two) times daily.      Marland Kitchen PRESCRIPTION MEDICATION Oral Take 1 tablet by mouth every 6 (six) hours as needed. Pain medication. Take as needed for pain.      BP 167/82  Pulse 85  Temp(Src) 97.9 F (36.6 C) (Oral)  Resp 16  SpO2 95%  Physical Exam  Nursing note and vitals reviewed. Constitutional: She is oriented to person, place, and time. She appears well-developed and well-nourished.  HENT:  Head: Normocephalic and atraumatic.  Neck: Normal range of motion. Neck supple.  Cardiovascular: Normal rate and regular rhythm.   Pulmonary/Chest: Effort normal and breath sounds normal.  Musculoskeletal:       Left elbow: She exhibits decreased range of motion. She exhibits no swelling, no effusion, no deformity and no laceration.  tenderness found.       Left upper extremity: Sensation is intact.  Distal pulses are intact.  Capillary refill is less than 2 seconds.  Neurological: She is alert and oriented to person, place, and time. No cranial nerve deficit or sensory deficit. She exhibits normal muscle tone. Coordination normal. GCS eye subscore is 4. GCS verbal subscore is 5. GCS motor subscore is 6.  Skin: Skin is intact. No abrasion, no ecchymosis and no laceration noted.  Psychiatric: She has a normal mood and affect. Her behavior is normal. Judgment and thought content normal.    ED Course  Procedures (including critical care time)  Labs Reviewed - No data to display Dg Elbow Complete Left  01/10/2012  *RADIOLOGY REPORT*  Clinical Data: Fall with elbow pain.  LEFT ELBOW - COMPLETE 3+ VIEW  Comparison: None.  Findings: Osteopenia.  Enthesopathy is seen off  the olecranon. There may be a small joint effusion.  No definite fracture.  IMPRESSION: Osteopenia with probable small joint effusion.  No definite fracture.  Original Report Authenticated By: Reyes Ivan, M.D.    Patient also seen and examined by Dr Anitra Lauth.  Patient declines pain medication at this time.     1. Left elbow contusion   2. Fall       MDM  Elderly patient with fall without LOC, did not hit head.  No neurological complaints or deficits.  Patient denies injury except for left elbow pain.  Xray shows small effusion without fracture.  Patient provided sling and pain medication for comfort, d/c home with PCP follow up.  Patient verbalizes understanding and agrees with plan.         Rise Patience, [place] 01/10/12 1447

## 2012-01-16 ENCOUNTER — Other Ambulatory Visit (INDEPENDENT_AMBULATORY_CARE_PROVIDER_SITE_OTHER): Payer: Self-pay | Admitting: Surgery

## 2012-01-16 DIAGNOSIS — E042 Nontoxic multinodular goiter: Secondary | ICD-10-CM

## 2012-01-23 ENCOUNTER — Telehealth: Payer: Self-pay | Admitting: Internal Medicine

## 2012-01-23 NOTE — Telephone Encounter (Signed)
Patient called our office and left me a voicemail.  Patient states in December-2012, Dr. Alwyn Ren referred her to Neurology.  She was seen by Dr. Marjory Lies, and she does not like him at all.  Patient requesting a new referral to see Dr. Sandria Manly, or any other physician at [facility], besides [facility].  Per my call to [facility], patients requesting to change providers must write letter stating who they saw, why they want to change, and address it to the manager, Bernita Raisin, who will contact pt once letter received.  I have left message for patient to return my call.

## 2012-01-24 ENCOUNTER — Encounter: Payer: Self-pay | Admitting: Neurology

## 2012-01-24 NOTE — Telephone Encounter (Signed)
I informed patient of all below.  She understands, and states she did fall last week, and needs to see Neurologist asap.  I told her [facility] now has a neurologist, and she asked that I refer her to him, and have her placed on his wait list.  Also, patient still plans to write letter to [facility] addressed to their manager, Bernita Raisin.

## 2012-02-15 ENCOUNTER — Telehealth: Payer: Self-pay | Admitting: Internal Medicine

## 2012-02-15 ENCOUNTER — Other Ambulatory Visit (INDEPENDENT_AMBULATORY_CARE_PROVIDER_SITE_OTHER): Payer: Self-pay | Admitting: Surgery

## 2012-02-15 DIAGNOSIS — R29818 Other symptoms and signs involving the nervous system: Secondary | ICD-10-CM

## 2012-02-15 DIAGNOSIS — E042 Nontoxic multinodular goiter: Secondary | ICD-10-CM

## 2012-02-15 DIAGNOSIS — R2689 Other abnormalities of gait and mobility: Secondary | ICD-10-CM

## 2012-02-15 NOTE — Telephone Encounter (Signed)
Patient called and is requesting a referral for her daily balance issues. She states she has spoken to Dr Alwyn Ren about this during several visits. She is requesting a referral for: [facility] This is because it is close to her home Can call patient at work from 8-12 ph#[phone] After 12 home# [phone]

## 2012-02-15 NOTE — Telephone Encounter (Signed)
Dr.Hopper please advise 

## 2012-02-15 NOTE — Telephone Encounter (Signed)
Discuss with patient,referral faxed

## 2012-02-15 NOTE — Telephone Encounter (Signed)
PT referral as requested OK

## 2012-02-15 NOTE — Progress Notes (Signed)
Addended by: Charise Carwin on: 02/15/2012 03:12 PM   Modules accepted: Orders

## 2012-02-19 NOTE — Telephone Encounter (Signed)
Addended by: Maurice Small on: 02/19/2012 11:40 AM   Modules accepted: Orders

## 2012-03-07 ENCOUNTER — Other Ambulatory Visit: Payer: Self-pay | Admitting: Internal Medicine

## 2012-03-14 ENCOUNTER — Ambulatory Visit: Payer: Self-pay | Admitting: Neurology

## 2012-05-03 ENCOUNTER — Ambulatory Visit (INDEPENDENT_AMBULATORY_CARE_PROVIDER_SITE_OTHER): Payer: Medicare Other | Admitting: Internal Medicine

## 2012-05-03 ENCOUNTER — Encounter: Payer: Self-pay | Admitting: Internal Medicine

## 2012-05-03 ENCOUNTER — Telehealth: Payer: Self-pay | Admitting: Family Medicine

## 2012-05-03 VITALS — BP 134/70 | HR 87 | Temp 98.0°F | Wt 116.2 lb

## 2012-05-03 DIAGNOSIS — R7309 Other abnormal glucose: Secondary | ICD-10-CM

## 2012-05-03 DIAGNOSIS — M79662 Pain in left lower leg: Secondary | ICD-10-CM

## 2012-05-03 DIAGNOSIS — R269 Unspecified abnormalities of gait and mobility: Secondary | ICD-10-CM | POA: Insufficient documentation

## 2012-05-03 DIAGNOSIS — R209 Unspecified disturbances of skin sensation: Secondary | ICD-10-CM

## 2012-05-03 DIAGNOSIS — E042 Nontoxic multinodular goiter: Secondary | ICD-10-CM

## 2012-05-03 DIAGNOSIS — R609 Edema, unspecified: Secondary | ICD-10-CM

## 2012-05-03 DIAGNOSIS — M79609 Pain in unspecified limb: Secondary | ICD-10-CM

## 2012-05-03 DIAGNOSIS — R32 Unspecified urinary incontinence: Secondary | ICD-10-CM

## 2012-05-03 DIAGNOSIS — R208 Other disturbances of skin sensation: Secondary | ICD-10-CM

## 2012-05-03 DIAGNOSIS — R030 Elevated blood-pressure reading, without diagnosis of hypertension: Secondary | ICD-10-CM

## 2012-05-03 DIAGNOSIS — D696 Thrombocytopenia, unspecified: Secondary | ICD-10-CM

## 2012-05-03 LAB — CBC WITH DIFFERENTIAL/PLATELET
Basophils Relative: 0.5 % (ref 0.0–3.0)
Eosinophils Relative: 1.6 % (ref 0.0–5.0)
HCT: 39.7 % (ref 36.0–46.0)
Hemoglobin: 12.8 g/dL (ref 12.0–15.0)
Lymphs Abs: 2.5 10*3/uL (ref 0.7–4.0)
MCV: 94.9 fl (ref 78.0–100.0)
Monocytes Absolute: 0.7 10*3/uL (ref 0.1–1.0)
Monocytes Relative: 9 % (ref 3.0–12.0)
Neutro Abs: 4.3 10*3/uL (ref 1.4–7.7)
RBC: 4.19 Mil/uL (ref 3.87–5.11)
WBC: 7.7 10*3/uL (ref 4.5–10.5)

## 2012-05-03 LAB — BASIC METABOLIC PANEL
CO2: 30 mEq/L (ref 19–32)
Calcium: 9.8 mg/dL (ref 8.4–10.5)
Creatinine, Ser: 0.6 mg/dL (ref 0.4–1.2)
GFR: 111.14 mL/min (ref >60.00)
Sodium: 137 mEq/L (ref 135–145)

## 2012-05-03 LAB — HEPATIC FUNCTION PANEL
Alkaline Phosphatase: 45 U/L (ref 39–117)
Bilirubin, Direct: 0 mg/dL (ref 0.0–0.3)
Total Bilirubin: 0.4 mg/dL (ref 0.3–1.2)
Total Protein: 6.7 g/dL (ref 6.0–8.3)

## 2012-05-03 LAB — HEMOGLOBIN A1C: Hgb A1c MFr Bld: 5.9 % (ref 4.6–6.5)

## 2012-05-03 NOTE — Patient Instructions (Signed)
.  Share results with all MDs seen.Please try to go on My Chart within the next 24 hours to allow me to release the results directly to you.  

## 2012-05-03 NOTE — Telephone Encounter (Signed)
Called by patient's daughter Ana Fisher (home [phone] or mobile [phone]) about her mother and the positive D Dimer They were both on the phone at the same time and both agree that she does not want to go to the ER tonight for the abnormal test because she is feeling fine Once again denies cp or sob or leg pain  Wyn asked if we could instead try to schedule a venous doppler at a hospital on Saturday- and I told her I would try to do that first thing in the morning  They both understood the red flag symptoms to watch for including leg pain or swelling/ shortness of breath/ chest pain -- and will immediately call 911 and go to the hospital tonight  I also inst to call back with any questions

## 2012-05-03 NOTE — Telephone Encounter (Signed)
Received a call from the answering service about an abnormal D Dimer on this patient - looks like it was done for leg tenderness on exam from exam with Dr Alwyn Ren  I called patient - and informed her of results, explained that this could be a sign of a blood clot in her leg and that it cannot wait until Monday to be addressed  She stated she was feeling fine except for fatigue , and denied sob /cp or other symptoms  I advised her to go to the ER ([facility] or [facility]) for further evaluation of possible blood clot - and she voiced understanding , will try to find a ride since she does not drive at night  I will forward this to Dr Alwyn Ren

## 2012-05-03 NOTE — Progress Notes (Signed)
  Subjective:    Patient ID: Ana Fisher, female    DOB: Apr 06, 1923, 78 y.o.   MRN: [id number]  HPI She fell in February of this year her related to malfunctioning of the electronic door. There was no cardiac or neuro prodrome prior to the fall. She sustained an injury to the elbow for which she's been seen at [facility].  Ataxia was diagnosed by Dr. Craige Cotta, neurologist. Physical therapy was recommended. A formal program could not be completed however. She has been employing a 3 pronged cane which had been prescribed by orthopedics following TKR    Review of Systems The edema persists but is stable. She denies paroxysmal nocturnal dyspnea. She does restrict sodium. The last lab evaluation record was 08/08/11. That time creatinine was 0.5, BUN 19, and GFR 113.71. AST and ALT were also normal. She is not unable to tolerate compression hose  As stated she did not have palpitations, chest pain, extremity weakness or numbness, or headache prior to the fall. She does describe burning in feet     Objective:   Physical Exam Gen.: thin but  well-nourished in appearance. Alert, appropriate and cooperative throughout exam.   Eyes: No corneal or conjunctival inflammation noted. No icterus  Neck: No deformities, masses, or tenderness noted.  Thyroid w/o papalble nodule. Lungs: Normal respiratory effort; chest expands symmetrically. Lungs are clear to auscultation without rales, wheezes, or increased work of breathing. Heart: Normal rate and rhythm. Normal S1 and S2. No gallop, click, or rub. S4 w/o  murmur. Abdomen: Bowel sounds normal; abdomen soft and nontender. No masses, organomegaly or hernias noted.                                                                          Musculoskeletal/extremities:  No clubbing, cyanosis or deformity noted. Tone & strength  normal.Joints normal. Nail health  good. Edema is pitting, 1+ to the mid to upper shin area. There is no definite Homans sign  present but she does have some discomfort with compression of the calves  Vascular: Carotid, radial artery, dorsalis pedis and  posterior tibial pulses are equal. Pedal pulses are decreased due to the edema. No bruits present. Varicose veins visible in the lower extremities Neurologic: Alert and oriented x3. Deep tendon reflexes symmetrical and normal.          Skin: Intact without suspicious lesions or rashes. Lymph: No cervical, axillary lymphadenopathy present. Psych: Mood and affect are normal. Normally interactive                                                                                         Assessment & Plan:

## 2012-05-03 NOTE — Assessment & Plan Note (Signed)
Renal function will be monitored

## 2012-05-03 NOTE — Assessment & Plan Note (Signed)
In addition renal function, AST and ALT will be checked. Because of equivocal Homans sign; d-dimer were also be checked

## 2012-05-03 NOTE — Assessment & Plan Note (Signed)
Because of the burning in her feet and past history of elevated fasting glucose; A1c will be checked

## 2012-05-04 ENCOUNTER — Ambulatory Visit (HOSPITAL_COMMUNITY)
Admission: RE | Admit: 2012-05-04 | Discharge: 2012-05-04 | Disposition: A | Discharge status: Home / Self Care | Payer: Medicare Other | Source: Ambulatory Visit | Attending: Family Medicine | Admitting: Family Medicine

## 2012-05-04 ENCOUNTER — Telehealth: Payer: Self-pay | Admitting: Family Medicine

## 2012-05-04 DIAGNOSIS — M79606 Pain in leg, unspecified: Secondary | ICD-10-CM

## 2012-05-04 DIAGNOSIS — M79609 Pain in unspecified limb: Secondary | ICD-10-CM

## 2012-05-04 DIAGNOSIS — R7989 Other specified abnormal findings of blood chemistry: Secondary | ICD-10-CM

## 2012-05-04 NOTE — Telephone Encounter (Signed)
Positive D Dimer  Needs venous duplex Ordered at [facility] at 11 am

## 2012-05-04 NOTE — Telephone Encounter (Signed)
Call report - venous duplex is negative for DVT - informed daughter Ayesha Mohair at [phone] Will update if any change in symptoms  Will call office Monday for any further instructions

## 2012-05-06 NOTE — Progress Notes (Signed)
VASCULAR LAB PRELIMINARY  PRELIMINARY  PRELIMINARY  PRELIMINARY  Bilateral lower extremity venous duplex completed.    Preliminary report:  Bilateral:  No evidence of DVT, superficial thrombosis, or Baker's Cyst.   NATHAN, MICHELLE, RDMS, RDCS 05/06/2012, 4:15 PM

## 2012-05-06 NOTE — Telephone Encounter (Signed)
[facility] Triage Call Report Triage Record Num: [id number] Operator: Caswell Corwin Patient Name: Ana Fisher Call Date & Time: 05/03/2012 7:53:08PM Patient Phone: [phone] PCP: Lelon Perla Patient Gender: Female PCP Fax : [phone] Patient DOB: [birth year] Practice Name: [facility] Reason for Call: Caller: Tamra from [employer]. PCP: Lelon Perla.; CB#: [phone]; Call regarding [name] is calling from [facility] regarding a D-Dimer ordered on [name] by Marga Melnick.; [facility] Results are D-Dimer 1.09 -high. Previous = none. Bld not hemolyzed. Called Dr. Roxy Manns and info given. Protocol(s) Used: PCP Calls, No Triage (Adult) Recommended Outcome per Protocol: Call Provider within 24 Hours Reason for Outcome: Lab calling with test results Care Advice:

## 2012-05-07 ENCOUNTER — Encounter: Payer: Self-pay | Admitting: *Deleted

## 2012-05-14 ENCOUNTER — Other Ambulatory Visit: Payer: Self-pay | Admitting: *Deleted

## 2012-05-14 DIAGNOSIS — R600 Localized edema: Secondary | ICD-10-CM

## 2012-05-15 ENCOUNTER — Telehealth: Payer: Self-pay | Admitting: Internal Medicine

## 2012-05-15 NOTE — Telephone Encounter (Signed)
Per Dr.Hopper Vascular doctor

## 2012-05-15 NOTE — Telephone Encounter (Signed)
There is a referral in epic entered as Vascular Surgery, requesting Dr. Eldridge Dace, but he is Cardiology.  Patient has leg edema, and this doctor does not do surgery.  Do you want patient to this cardiologist or a Vascular surgeon?  Please clarify this referral.

## 2012-05-16 ENCOUNTER — Telehealth: Payer: Self-pay | Admitting: Internal Medicine

## 2012-05-16 ENCOUNTER — Other Ambulatory Visit: Payer: Self-pay

## 2012-05-16 DIAGNOSIS — R600 Localized edema: Secondary | ICD-10-CM

## 2012-05-16 DIAGNOSIS — I83893 Varicose veins of bilateral lower extremities with other complications: Secondary | ICD-10-CM

## 2012-05-16 NOTE — Telephone Encounter (Signed)
Per call from [facility] appt has been scheduled at vascular & vein, 8.9 at 2pm, with dr Royetta Asal also called the patient to make her aware, also note they have all the records information needed.

## 2012-06-04 ENCOUNTER — Other Ambulatory Visit: Payer: Self-pay | Admitting: Internal Medicine

## 2012-07-05 ENCOUNTER — Encounter: Payer: Self-pay | Admitting: Vascular Surgery

## 2012-07-12 ENCOUNTER — Encounter: Payer: Self-pay | Admitting: Vascular Surgery

## 2012-07-31 ENCOUNTER — Encounter: Payer: Self-pay | Admitting: Vascular Surgery

## 2012-08-01 ENCOUNTER — Encounter (INDEPENDENT_AMBULATORY_CARE_PROVIDER_SITE_OTHER): Payer: Medicare Other | Admitting: *Deleted

## 2012-08-01 ENCOUNTER — Ambulatory Visit (INDEPENDENT_AMBULATORY_CARE_PROVIDER_SITE_OTHER): Payer: Medicare Other | Admitting: Vascular Surgery

## 2012-08-01 ENCOUNTER — Encounter: Payer: Self-pay | Admitting: Vascular Surgery

## 2012-08-01 VITALS — BP 141/71 | HR 73 | Resp 16 | Ht 61.5 in | Wt 113.0 lb

## 2012-08-01 DIAGNOSIS — R600 Localized edema: Secondary | ICD-10-CM

## 2012-08-01 DIAGNOSIS — I83893 Varicose veins of bilateral lower extremities with other complications: Secondary | ICD-10-CM

## 2012-08-01 NOTE — Progress Notes (Signed)
[facility] [place] HISTORY AND PHYSICAL   History of Present Illness:  Patient is a 78 y.o. year old female who presents for evaluation of leg and ankle swelling.  The swelling has been present for years. However it has become worse over the last year. She is on Lasix with some improvement of symptoms. She denies prior history of DVT. She denies any prior interventions in her lower extremities. She states that she develops burning aching fullness and heaviness in her legs as the day goes on. Her swelling becomes bad enough that some times she has difficulty getting her shoes on her feet. She currently works as a Agricultural consultant at [employer]. Other medical problems include prior history of breast cancer 9 remission, osteoporosis, goiter. These are all currently stable and followed by Dr. Alwyn Ren.  She had a fairly recent echocardiogram which showed good ejection fraction.  Past Medical History  Diagnosis Date  . Goiter, nontoxic, multinodular   . Urinary incontinence   . Ankle edema 2009  . Constipation   . Hx of colonic polyps   . Osteoporosis   . Breast cancer     Past Surgical History  Procedure Date  . Appendectomy   . Colonoscopy w/ polypectomy   . Abdominal hysterectomy   . Bilateral salpingoophorectomy   . Mastectomy     bilateral  . Tonsillectomy   . Total knee arthroplasty   . Cataract extraction, bilateral      Social History History  Substance Use Topics  . Smoking status: Former Smoker    Quit date: 11/27/1982  . Smokeless tobacco: Not on file  . Alcohol Use: Yes     wine occasionally    Family History Family History  Problem Relation Age of Onset  . Cancer Father     lung cancer  . Aortic aneurysm Brother   . Hypertension Mother   . Cancer Daughter     Allergies  Allergies  Allergen Reactions  . Penicillins     hives  . Sulfonamide Derivatives     She denies allergy to Sulfa     Current Outpatient Prescriptions    Medication Sig Dispense Refill  . Aspirin (ECOTRIN PO) Take by mouth daily.      . Calcium Carbonate (CALTRATE 600 PO) Take by mouth 2 (two) times daily.        . furosemide (LASIX) 20 MG tablet TAKE 1 TO 2 TABLETS BY MOUTH ONCE DAILY  180 tablet  0  . hydrochlorothiazide (HYDRODIURIL) 25 MG tablet Take 25 mg by mouth daily.      . Ibandronate Sodium (BONIVA PO) Take by mouth daily.      . Omega-3 Fatty Acids (FISH OIL) 1200 MG CAPS Take by mouth 2 (two) times daily.        Marland Kitchen oxybutynin (DITROPAN) 5 MG tablet Take 5 mg by mouth 3 (three) times daily.      . Solifenacin Succinate (VESICARE PO) Take by mouth daily.      Marland Kitchen oxybutynin (DITROPAN) 5 MG tablet Take 1 tablet (5 mg total) by mouth 2 (two) times daily.  60 tablet  5  . Trospium Chloride (SANCTURA XR) 60 MG CP24 Take 1 capsule by mouth daily.        ROS:   General:  No weight loss, Fever, chills  HEENT: No recent headaches, no nasal bleeding, no visual changes, no sore throat  Neurologic: No dizziness, blackouts, seizures. No recent symptoms of stroke or mini- stroke.  No recent episodes of slurred speech, or temporary blindness.  Cardiac: No recent episodes of chest pain/pressure, no shortness of breath at rest.  No shortness of breath with exertion.  Denies history of atrial fibrillation or irregular heartbeat  Vascular: No history of rest pain in feet.  No history of claudication.  No history of non-healing ulcer, No history of DVT   Pulmonary: No home oxygen, no productive cough, no hemoptysis,  No asthma or wheezing  Musculoskeletal:  [ ]  Arthritis, [ ]  Low back pain,  [ ]  Joint pain  Hematologic:No history of hypercoagulable state.  No history of easy bleeding.  No history of anemia  Gastrointestinal: No hematochezia or melena,  No gastroesophageal reflux, no trouble swallowing  Urinary: [ ]  chronic Kidney disease, [ ]  on HD - [ ]  MWF or [ ]  TTHS, [ ]  Burning with urination, [ ]  Frequent urination, [ ]  Difficulty  urinating;   Skin: No rashes  Psychological: No history of anxiety,  No history of depression   Physical Examination  Filed Vitals:   08/01/12 0939  BP: 141/71  Pulse: 73  Resp: 16  Height: 5' 1.5" (1.562 m)  Weight: 113 lb (51.256 kg)  SpO2: 100%    Body mass index is 21.01 kg/(m^2).  General:  Alert and oriented, no acute distress HEENT: Normal Neck: No bruit or JVD Pulmonary: Clear to auscultation bilaterally Cardiac: Regular Rate and Rhythm without murmur Abdomen: Soft, non-tender, non-distended, no mass Skin: No rash, no ulcer Extremity Pulses:  2+ radial, brachial, femoral, dorsalis pedis pulses bilaterally Musculoskeletal: No deformity pitting edema which overlaps the ankles bilaterally  Neurologic: Upper and lower extremity motor 5/5 and symmetric  DATA: Patient had a venous duplex exam today which I reviewed and interpreted. This showed bilateral greater saphenous vein reflux. She also had bilateral common femoral vein reflux. There was no evidence of DVT.   ASSESSMENT: I had a lengthy discussion with the patient today regarding pathophysiology of venous reflux. I did discuss with her the possibility of considering laser ablation of the reflux in her greater saphenous vein. However I also discussed with her that she has deep venous reflux as well so all of her symptoms may not totally resolve with the laser ablation. I did discuss with her today compression stockings. I believe she would benefit from compression therapy significantly. However, she has had difficulty been compliant with compression therapy in the past because of her swollen ankles pinching from the stockings. However she has consented to try an additional attempt at compression therapy. She was given a prescription for thigh high compression stockings today.   PLAN:  Bilateral thigh high compression stockings for synthetic relief. The patient will consider whether or not she wishes a 3 month followup  visit to consider laser ablation. If she wishes to consider laser ablation she will have this point scheduled with 1 of my partners Dr. Arbie Cookey or Dr. Hart Rochester who are currently doing the laser therapy.  Fabienne Bruns, MD [facility] [place] Office: [phone] Pager: [phone]

## 2012-08-09 ENCOUNTER — Encounter: Payer: Self-pay | Admitting: Vascular Surgery

## 2012-09-06 ENCOUNTER — Encounter: Payer: Self-pay | Admitting: Internal Medicine

## 2012-09-06 ENCOUNTER — Ambulatory Visit (INDEPENDENT_AMBULATORY_CARE_PROVIDER_SITE_OTHER): Payer: Medicare Other | Admitting: Internal Medicine

## 2012-09-06 VITALS — BP 118/72 | HR 84 | Temp 97.6°F | Resp 20 | Ht 61.5 in | Wt 117.0 lb

## 2012-09-06 DIAGNOSIS — I83893 Varicose veins of bilateral lower extremities with other complications: Secondary | ICD-10-CM

## 2012-09-06 DIAGNOSIS — D696 Thrombocytopenia, unspecified: Secondary | ICD-10-CM

## 2012-09-06 DIAGNOSIS — R269 Unspecified abnormalities of gait and mobility: Secondary | ICD-10-CM

## 2012-09-06 LAB — CBC WITH DIFFERENTIAL/PLATELET
Basophils Absolute: 0 10*3/uL (ref 0.0–0.1)
Eosinophils Relative: 1.8 % (ref 0.0–5.0)
HCT: 40 % (ref 36.0–46.0)
Hemoglobin: 13.1 g/dL (ref 12.0–15.0)
Lymphs Abs: 2.5 10*3/uL (ref 0.7–4.0)
MCV: 93.9 fl (ref 78.0–100.0)
Monocytes Absolute: 0.8 10*3/uL (ref 0.1–1.0)
Monocytes Relative: 9.7 % (ref 3.0–12.0)
Neutro Abs: 4.9 10*3/uL (ref 1.4–7.7)
Platelets: 148 10*3/uL — ABNORMAL LOW (ref 150.0–400.0)
RDW: 14 % (ref 11.5–14.6)

## 2012-09-06 NOTE — Patient Instructions (Addendum)
Review and correct the record as indicated. Please share record with all medical staff seen.  Because of slightly low platelet count, avoid excess aspirin , ibuprofen, naproxen etc .Repeat platelet count if  any abnormal bruising or bleeding occur.

## 2012-09-06 NOTE — Progress Notes (Signed)
Subjective:    Patient ID: Ana Fisher, female    DOB: 1923/04/08, 78 y.o.   MRN: [id number]  HPI She continues to have balance issues; she must walk with a three-pronged cane. She is unable to navigate stairs without using the cane or  railing.  She states that it is difficult for her to initiate walking or to stop. She describes it as "running in place" prior to ambulating.  She also notices some delay in ability to express her self on the phone.  She discussed this with her daughter-in-law who is a Engineer, civil (consulting). Her daughter-in-law were recommended she see me to initiate a workup for Parkinson's  I reviewed the old record; she was seen 03/04/12 at [facility] [place] [facility] by Dr. Craige Cotta. Ataxia was diagnosed. Dr. Craige Cotta stated that she did not see signs or symptoms of Parkinson's disease such as tremor, bradykinesia, cogwheeling, or rigidity.       Review of Systems On review of  the chart , mild TCP was noted in June. She does have intermittent minor epistaxis and easy bruising. She denies hemoptysis, melena, rectal bleeding, or hematuria.  She is followed by urologist for bladder dysfunction and is on Trospium . She was previously on oxybutynin  she did have mild reduction in platelet count 137,000     Objective:   Physical Exam Gen.: Thin but adequately nourished in appearance. Alert, appropriate and cooperative throughout exam.Decreased facial expression with mask facies clinically  Eyes: No corneal or conjunctival inflammation noted.  Extraocular motion intact.   Neck: No deformities, masses, or tenderness noted. Range of motion decreased laterally. No NVD Lungs: Normal respiratory effort; chest expands symmetrically. Lungs are clear to auscultation without rales, wheezes, or increased work of breathing. Heart: Normal rate and rhythm. Normal S1 and S2. No gallop, click, or rub. S4 w/o murmur. Abdomen: Bowel sounds normal; abdomen soft and nontender. No masses, organomegaly or hernias  noted.No HJR                                                                          Musculoskeletal/extremities: Some ulnar deviation of the fingers. Weakness suggested in the right hand. Dramatic pedal edema; right worse than left despite support hose Vascular: Carotid & radial artery pulses  are full and equal. No bruits present.Decreased dorsalis pedis and  posterior tibial pulses due to edema Neurologic: Alert and oriented x3; she had some difficulty recurring names of physicians and medication history. Deep tendon reflexes symmetrical and normal. She is exhibiting some delay in initiation with ambulation. Gait is broad-based and non-fluid. She stopped in terms slowly.          Skin: Intact without suspicious lesions or rashes. Lymph: No cervical, axillary lymphadenopathy present. Psych: Mood and affect are normal. Normally interactive  Assessment & Plan:  #1 ataxia; rule out Parkinson's. Reevaluation by Dr.Kirby would be appropriate  in view of her concerns and clinical picture. She requests another Neurology group assessment  . It should be verified whether the urologic medications could be playing a role in this clinical picture. She previously had been on oxybutynin & VESIcare.  #2 mild thrombocytopenia; precautions discussed

## 2012-09-09 ENCOUNTER — Telehealth: Payer: Self-pay | Admitting: Internal Medicine

## 2012-09-09 NOTE — Telephone Encounter (Signed)
Patient is calling, and is not sure exactly what medications Dr. Alwyn Ren wants her to take, and which ones not to take.  She was here for OV on 09-06-12, but states this was not made clear to her.    If calling patient before 4:30pm, please call [phone], if after 4:30pm, please call [phone].

## 2012-09-09 NOTE — Telephone Encounter (Signed)
Left message on home phone with Dr.Hopper's response, patient informed to call if she has questions. I called patient at work number, and was told patient left for the day

## 2012-09-09 NOTE — Telephone Encounter (Signed)
No change in medications until Neurology referral completed; they may make recommendations after evaluation

## 2012-09-11 ENCOUNTER — Encounter (INDEPENDENT_AMBULATORY_CARE_PROVIDER_SITE_OTHER): Payer: Medicare Other | Admitting: Ophthalmology

## 2012-09-11 DIAGNOSIS — H43819 Vitreous degeneration, unspecified eye: Secondary | ICD-10-CM

## 2012-09-11 DIAGNOSIS — D3130 Benign neoplasm of unspecified choroid: Secondary | ICD-10-CM

## 2012-09-11 DIAGNOSIS — H35379 Puckering of macula, unspecified eye: Secondary | ICD-10-CM

## 2012-09-13 ENCOUNTER — Other Ambulatory Visit: Payer: Self-pay | Admitting: Internal Medicine

## 2012-10-04 ENCOUNTER — Other Ambulatory Visit (HOSPITAL_COMMUNITY): Payer: Self-pay | Admitting: Neurology

## 2012-10-04 DIAGNOSIS — R269 Unspecified abnormalities of gait and mobility: Secondary | ICD-10-CM

## 2012-10-04 DIAGNOSIS — M4802 Spinal stenosis, cervical region: Secondary | ICD-10-CM

## 2012-10-05 ENCOUNTER — Other Ambulatory Visit: Payer: Self-pay | Admitting: Internal Medicine

## 2012-10-11 ENCOUNTER — Ambulatory Visit (HOSPITAL_COMMUNITY)
Admission: RE | Admit: 2012-10-11 | Discharge: 2012-10-11 | Disposition: A | Discharge status: Home / Self Care | Payer: Medicare Other | Source: Ambulatory Visit | Attending: Neurology | Admitting: Neurology

## 2012-10-11 DIAGNOSIS — M4802 Spinal stenosis, cervical region: Secondary | ICD-10-CM

## 2012-10-11 DIAGNOSIS — R269 Unspecified abnormalities of gait and mobility: Secondary | ICD-10-CM

## 2012-10-11 DIAGNOSIS — E042 Nontoxic multinodular goiter: Secondary | ICD-10-CM | POA: Insufficient documentation

## 2012-10-11 DIAGNOSIS — M47812 Spondylosis without myelopathy or radiculopathy, cervical region: Secondary | ICD-10-CM | POA: Insufficient documentation

## 2012-10-16 ENCOUNTER — Ambulatory Visit (INDEPENDENT_AMBULATORY_CARE_PROVIDER_SITE_OTHER): Payer: Medicare Other | Admitting: Internal Medicine

## 2012-10-16 ENCOUNTER — Encounter: Payer: Self-pay | Admitting: Internal Medicine

## 2012-10-16 VITALS — BP 126/80 | HR 85 | Wt 115.8 lb

## 2012-10-16 DIAGNOSIS — R269 Unspecified abnormalities of gait and mobility: Secondary | ICD-10-CM

## 2012-10-16 NOTE — Progress Notes (Signed)
  Subjective:    Patient ID: Ana Fisher, female    DOB: [birth year], 78 y.o.   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 She has reviewed this form and assessed her subjective limitations. These are: lifting less than 30 pounds; standing less than 5 minutes; squatting or kneeling less than 1 minute; carrying less than 15 pounds. She believes that ambulation is limited to 160 feet.  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  She had a total knee replacement 6 years ago and underwent physical therapy at after that surgery.  In February of this year she did fall injuring the left elbow. This limits her weightbearing to 15 pounds  Human Resources have commented that although this is a sedentary position it does require sufficient mobility to provide good customer service to patients,visitors , and employees. The job description would involve moving out from behind the reception desk  to give directions, greet visitors, or escort visitors to their destination.  Again ambulation is limited  To 160 feet; she has employed a 3 prong cane since her total knee replacement 6 years ago.  The written corrective action record 10/01/12 was reviewed. Instructions provided for correction were also reviewed.     Review of Systems  I had referred her to Dr. Anne Hahn to rule out Parkinson's or other movement disorder. His evaluation 10/02/2012 was reviewed; he felt that her gait disorder was multifactorial due  to peripheral neuropathy, age, degenerative joint disease and previous total knee replacement. He recommended considering physical therapy for gait training. She deferred this due to time constraints. Yoga was also recommended as an option. He did not feel that she had Parkinson's.     Objective:   Physical Exam  She appears adequately  nourished in no acute distress.  Range of motion is reduced at the knees. She has fusiform changes the knees and crepitus, left greater than right knee.  Strength is decreased opposition in the hands.  The left knee reflex is slightly decreased.  Her gait has been chopping and she turns slowly  She has dramatic pedal edema which is chronic.         Assessment & Plan:  #1 gait dysfunction, multifactorial.  Plan: Physical therapy will be pursued to assess functional limitations as well as the need for any equipment to facilitate mobilization & meet job requirements.  I recommend she take this record to [facility]; no corrective action should be taken until a full physical therapy evaluation can be completed.

## 2012-10-16 NOTE — Patient Instructions (Addendum)
Share record with  [facility]

## 2012-10-23 ENCOUNTER — Ambulatory Visit: Payer: Self-pay | Admitting: Physical Therapy

## 2012-11-06 ENCOUNTER — Ambulatory Visit: Payer: PRIVATE HEALTH INSURANCE | Attending: Internal Medicine | Admitting: Rehabilitation

## 2012-11-06 DIAGNOSIS — M25539 Pain in unspecified wrist: Secondary | ICD-10-CM | POA: Insufficient documentation

## 2012-11-06 DIAGNOSIS — R262 Difficulty in walking, not elsewhere classified: Secondary | ICD-10-CM | POA: Insufficient documentation

## 2012-11-06 DIAGNOSIS — R269 Unspecified abnormalities of gait and mobility: Secondary | ICD-10-CM | POA: Insufficient documentation

## 2012-11-06 DIAGNOSIS — IMO0001 Reserved for inherently not codable concepts without codable children: Secondary | ICD-10-CM | POA: Insufficient documentation

## 2013-01-01 ENCOUNTER — Ambulatory Visit: Payer: PRIVATE HEALTH INSURANCE | Attending: Internal Medicine | Admitting: Rehabilitation

## 2013-01-01 DIAGNOSIS — R262 Difficulty in walking, not elsewhere classified: Secondary | ICD-10-CM | POA: Insufficient documentation

## 2013-01-01 DIAGNOSIS — M25539 Pain in unspecified wrist: Secondary | ICD-10-CM | POA: Insufficient documentation

## 2013-01-01 DIAGNOSIS — R269 Unspecified abnormalities of gait and mobility: Secondary | ICD-10-CM | POA: Insufficient documentation

## 2013-01-01 DIAGNOSIS — IMO0001 Reserved for inherently not codable concepts without codable children: Secondary | ICD-10-CM | POA: Insufficient documentation

## 2013-01-06 ENCOUNTER — Ambulatory Visit: Payer: PRIVATE HEALTH INSURANCE | Admitting: Rehabilitation

## 2013-01-07 ENCOUNTER — Ambulatory Visit: Payer: PRIVATE HEALTH INSURANCE | Admitting: Rehabilitation

## 2013-01-08 ENCOUNTER — Ambulatory Visit: Payer: PRIVATE HEALTH INSURANCE | Admitting: Rehabilitation

## 2013-01-09 ENCOUNTER — Ambulatory Visit: Payer: PRIVATE HEALTH INSURANCE | Admitting: Rehabilitation

## 2013-01-14 ENCOUNTER — Ambulatory Visit: Payer: PRIVATE HEALTH INSURANCE | Admitting: Rehabilitation

## 2013-01-16 ENCOUNTER — Ambulatory Visit: Payer: PRIVATE HEALTH INSURANCE | Admitting: Rehabilitation

## 2013-01-17 ENCOUNTER — Encounter: Payer: Medicare Other | Admitting: Rehabilitation

## 2013-01-21 ENCOUNTER — Ambulatory Visit: Payer: PRIVATE HEALTH INSURANCE | Admitting: Rehabilitation

## 2013-01-23 ENCOUNTER — Ambulatory Visit: Payer: PRIVATE HEALTH INSURANCE | Admitting: Rehabilitation

## 2013-01-28 ENCOUNTER — Encounter (INDEPENDENT_AMBULATORY_CARE_PROVIDER_SITE_OTHER): Payer: Self-pay | Admitting: General Surgery

## 2013-01-28 ENCOUNTER — Ambulatory Visit: Payer: PRIVATE HEALTH INSURANCE | Attending: Internal Medicine | Admitting: Rehabilitation

## 2013-01-28 DIAGNOSIS — IMO0001 Reserved for inherently not codable concepts without codable children: Secondary | ICD-10-CM | POA: Insufficient documentation

## 2013-01-28 DIAGNOSIS — R262 Difficulty in walking, not elsewhere classified: Secondary | ICD-10-CM | POA: Insufficient documentation

## 2013-01-28 DIAGNOSIS — R269 Unspecified abnormalities of gait and mobility: Secondary | ICD-10-CM | POA: Insufficient documentation

## 2013-01-28 DIAGNOSIS — M25539 Pain in unspecified wrist: Secondary | ICD-10-CM | POA: Insufficient documentation

## 2013-01-30 ENCOUNTER — Ambulatory Visit: Payer: PRIVATE HEALTH INSURANCE | Admitting: Rehabilitation

## 2013-02-04 ENCOUNTER — Ambulatory Visit: Payer: PRIVATE HEALTH INSURANCE | Admitting: Rehabilitation

## 2013-02-06 ENCOUNTER — Ambulatory Visit: Payer: PRIVATE HEALTH INSURANCE | Admitting: Rehabilitation

## 2013-02-11 ENCOUNTER — Ambulatory Visit: Payer: PRIVATE HEALTH INSURANCE | Admitting: Rehabilitation

## 2013-02-13 ENCOUNTER — Ambulatory Visit: Payer: PRIVATE HEALTH INSURANCE | Admitting: Rehabilitation

## 2013-02-13 ENCOUNTER — Ambulatory Visit: Payer: PRIVATE HEALTH INSURANCE | Admitting: Physical Therapy

## 2013-02-18 ENCOUNTER — Ambulatory Visit: Payer: PRIVATE HEALTH INSURANCE | Admitting: Rehabilitation

## 2013-02-20 ENCOUNTER — Ambulatory Visit: Payer: PRIVATE HEALTH INSURANCE | Admitting: Rehabilitation

## 2013-02-25 ENCOUNTER — Ambulatory Visit: Payer: PRIVATE HEALTH INSURANCE | Attending: Internal Medicine | Admitting: Rehabilitation

## 2013-02-25 DIAGNOSIS — R269 Unspecified abnormalities of gait and mobility: Secondary | ICD-10-CM | POA: Insufficient documentation

## 2013-02-25 DIAGNOSIS — IMO0001 Reserved for inherently not codable concepts without codable children: Secondary | ICD-10-CM | POA: Insufficient documentation

## 2013-02-25 DIAGNOSIS — R262 Difficulty in walking, not elsewhere classified: Secondary | ICD-10-CM | POA: Insufficient documentation

## 2013-02-25 DIAGNOSIS — M25539 Pain in unspecified wrist: Secondary | ICD-10-CM | POA: Insufficient documentation

## 2013-02-27 ENCOUNTER — Encounter: Payer: Self-pay | Admitting: Internal Medicine

## 2013-02-27 ENCOUNTER — Ambulatory Visit (INDEPENDENT_AMBULATORY_CARE_PROVIDER_SITE_OTHER): Payer: Medicare Other | Admitting: Internal Medicine

## 2013-02-27 ENCOUNTER — Ambulatory Visit: Payer: PRIVATE HEALTH INSURANCE | Admitting: Rehabilitation

## 2013-02-27 VITALS — BP 122/74 | HR 91 | Wt 115.0 lb

## 2013-02-27 DIAGNOSIS — R269 Unspecified abnormalities of gait and mobility: Secondary | ICD-10-CM

## 2013-02-27 DIAGNOSIS — R5383 Other fatigue: Secondary | ICD-10-CM

## 2013-02-27 DIAGNOSIS — R5381 Other malaise: Secondary | ICD-10-CM

## 2013-02-27 DIAGNOSIS — E042 Nontoxic multinodular goiter: Secondary | ICD-10-CM

## 2013-02-27 LAB — CBC WITH DIFFERENTIAL/PLATELET
Basophils Absolute: 0 10*3/uL (ref 0.0–0.1)
Eosinophils Relative: 0.9 % (ref 0.0–5.0)
HCT: 38 % (ref 36.0–46.0)
Lymphs Abs: 2.1 10*3/uL (ref 0.7–4.0)
MCV: 91.4 fl (ref 78.0–100.0)
Monocytes Absolute: 0.6 10*3/uL (ref 0.1–1.0)
Neutrophils Relative %: 61.6 % (ref 43.0–77.0)
Platelets: 150 10*3/uL (ref 150.0–400.0)
RDW: 14.5 % (ref 11.5–14.6)
WBC: 7.4 10*3/uL (ref 4.5–10.5)

## 2013-02-27 LAB — T4, FREE: Free T4: 1.21 ng/dL (ref 0.60–1.60)

## 2013-02-27 LAB — TSH: TSH: 0.48 u[IU]/mL (ref 0.35–5.50)

## 2013-02-27 MED ORDER — WALKER MISC: Status: DC

## 2013-02-27 NOTE — Patient Instructions (Addendum)

## 2013-02-27 NOTE — Addendum Note (Signed)
Addended by: Maurice Small on: 02/27/2013 05:06 PM   Modules accepted: Orders

## 2013-02-27 NOTE — Progress Notes (Signed)
  Subjective:    Patient ID: Ana Fisher, female    DOB: 29-Oct-1923, 79 y.o.   MRN: [id number]  HPI  She is conscientiously concerned about a bill she received for services rendered 01/10/12 after a fall. At that time she was on the [facility] staff. She was treated for injury to the elbow. Osteopenia was documented with probable small joint effusion. A left elbow contusion was diagnosed  She has been in physical therapy because of gait disturbance. That report was reviewed. There's been significant improvement in her stutter step.  She does have some residual issues with navigating curbs.      Review of Systems   She is describing fatigue despite sleeping 8-9 hours. She does have a past history of thyroid supplementation. She denies unexplained weight loss, melena, rectal bleeding.  Her genitourinary symptoms have been responsive to the trospium chloride from a urologist.  Michael Litter are pink a.ve:   Physical Exam   She is alert and appears adequately nourished. She appears younger than stated age  Thyroid is small.  She has no lymphadenopathy about the neck or axilla  There is weakness to opposition in the right hand. Otherwise strength appears good. Tone also appears normal  Deep tendon reflexes are decreased at the knees.  She has no significant arthritic changes in the hands.  Lipedema persists bilaterally       Assessment & Plan:  #1 fatigue; see labs  #2 concerns related to medical billing; she will be referred to Dudley Major RN, BSN, CCM, Care Management Coordinator of [facility] Care Management

## 2013-03-04 ENCOUNTER — Ambulatory Visit: Payer: PRIVATE HEALTH INSURANCE | Attending: Internal Medicine | Admitting: Rehabilitation

## 2013-03-04 DIAGNOSIS — R262 Difficulty in walking, not elsewhere classified: Secondary | ICD-10-CM | POA: Insufficient documentation

## 2013-03-04 DIAGNOSIS — IMO0001 Reserved for inherently not codable concepts without codable children: Secondary | ICD-10-CM | POA: Insufficient documentation

## 2013-03-04 DIAGNOSIS — Z96659 Presence of unspecified artificial knee joint: Secondary | ICD-10-CM | POA: Insufficient documentation

## 2013-03-04 DIAGNOSIS — R269 Unspecified abnormalities of gait and mobility: Secondary | ICD-10-CM | POA: Insufficient documentation

## 2013-03-04 DIAGNOSIS — M25539 Pain in unspecified wrist: Secondary | ICD-10-CM | POA: Insufficient documentation

## 2013-03-06 ENCOUNTER — Ambulatory Visit: Payer: PRIVATE HEALTH INSURANCE | Admitting: Rehabilitation

## 2013-03-12 ENCOUNTER — Ambulatory Visit: Payer: PRIVATE HEALTH INSURANCE | Admitting: Rehabilitation

## 2013-03-21 ENCOUNTER — Telehealth: Payer: Self-pay | Admitting: Internal Medicine

## 2013-03-21 DIAGNOSIS — M810 Age-related osteoporosis without current pathological fracture: Secondary | ICD-10-CM

## 2013-03-21 NOTE — Telephone Encounter (Signed)
Patient states she needs an order stating she needs wheelchair for "medical necessity for home health equipment" and also a purchase order and receipt. Pt will be using [employer].

## 2013-03-21 NOTE — Telephone Encounter (Signed)
Paperwork filled out and given to hopp for signature.

## 2013-03-21 NOTE — Telephone Encounter (Signed)
Patient needs order sent to [facility] [address] [place] ph # [phone]

## 2013-03-21 NOTE — Telephone Encounter (Signed)
Paperwork faxed on 4/25.

## 2013-03-21 NOTE — Telephone Encounter (Signed)
Order entered/faxed

## 2013-04-17 ENCOUNTER — Other Ambulatory Visit: Payer: Self-pay | Admitting: Internal Medicine

## 2013-06-19 ENCOUNTER — Ambulatory Visit (INDEPENDENT_AMBULATORY_CARE_PROVIDER_SITE_OTHER): Payer: Medicare Other | Admitting: Internal Medicine

## 2013-06-19 ENCOUNTER — Encounter: Payer: Self-pay | Admitting: Internal Medicine

## 2013-06-19 VITALS — BP 122/74 | HR 81 | Temp 98.2°F | Wt 112.0 lb

## 2013-06-19 DIAGNOSIS — S40812A Abrasion of left upper arm, initial encounter: Secondary | ICD-10-CM

## 2013-06-19 DIAGNOSIS — IMO0002 Reserved for concepts with insufficient information to code with codable children: Secondary | ICD-10-CM

## 2013-06-19 NOTE — Patient Instructions (Addendum)
Dip gauze in  sterile saline and applied to the wound two- 3  a day. Cover the wound with Telfa , non stick dressing  without any antibiotic ointment. The saline can be purchased at the drugstore or you can make your own .Boil cup of salt in a gallon of water. Store mixture  in a clean container.Report Warning  signs as discussed (red streaks, pus, fever, increasing pain). Repeat the isometric exercises discussed 4- 5 times prior to standing if you've been in bed or even  seated for a period of time.

## 2013-06-19 NOTE — Progress Notes (Signed)
  Subjective:    Patient ID: Ana Fisher, female    DOB: 1923/07/24, 79 y.o.   MRN: [id number]  HPI   She arose from the bed from a sound sleep approximately 2:30 AM 7/23 to go the bathroom. She fell scraping her left elbow area.  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   Review of Systems She specifically denies any cardiac or neurologic prodrome prior to the fall. Specifically she denied any changes in her heart rhythm or rate. She had no headache, numbness or tingling in her limbs, or limb weakness. She denies significant lightheadedness or presyncope.        Objective:   Physical Exam  She is alert and appears adequately nourished and in no distress.  She has no lymphadenopathy about the neck, axilla or epitrochlear areas.  Shows a regular rhythm without murmur.  Carotid arteries revealed no bruits  There is a superficial abrasion of the left elbow 5.5 x 2.5 cm. There is no evidence of active cellulitis        Assessment & Plan:  #1 abrasion without evidence of cellulitis.  Plan: Wet to dry saline can be recommended. Antibiotic ointment should only be applied if there is appearance of pus. The lesion should be left alone once eschar begins to form.

## 2013-06-23 ENCOUNTER — Ambulatory Visit: Payer: Self-pay | Admitting: Internal Medicine

## 2013-06-27 ENCOUNTER — Ambulatory Visit (INDEPENDENT_AMBULATORY_CARE_PROVIDER_SITE_OTHER): Payer: Medicare Other | Admitting: Internal Medicine

## 2013-06-27 VITALS — BP 130/86 | HR 78 | Temp 98.3°F | Wt 114.6 lb

## 2013-06-27 DIAGNOSIS — IMO0002 Reserved for concepts with insufficient information to code with codable children: Secondary | ICD-10-CM

## 2013-06-27 DIAGNOSIS — S40812S Abrasion of left upper arm, sequela: Secondary | ICD-10-CM

## 2013-06-27 DIAGNOSIS — T148XXS Other injury of unspecified body region, sequela: Secondary | ICD-10-CM

## 2013-06-27 NOTE — Patient Instructions (Addendum)
Dip gauze in  sterile saline and applied to the wound twice a day. Cover the wound with Telfa as needed, non stick dressing  without any antibiotic or other ointment. Report Warning  signs as discussed (red streaks, pus, fever, increasing pain). Use paper tape.

## 2013-06-27 NOTE — Progress Notes (Signed)
  Subjective:    Patient ID: Ana Fisher, female    DOB: 01/14/1923, 79 y.o.   MRN: [id number]  HPI   The abrasion over the ventral lateral aspect of the left forearm had been responding to bid saline compresses. One day she applied Vicks vapor rub at the advice of a friend; this resulted in increase in "oozing" from the lesion  There has been no purulence. She also denies fever, chills, or sweats.    Review of Systems     Objective:   Physical Exam  She is thin but appears adequately nourished in no distress  She has no cervical, axillary, or epitrochlear lymph nodes. The 42 x 14 mm abrasion reveals healing tissue at its base without evidence of purulence or cellulitis.No ischemic skin changes  Excellent radial artery pulse       Assessment & Plan:  #1 abrasion healing without evidence of secondary infection  Plan: See orders and recommendations.

## 2013-07-02 ENCOUNTER — Other Ambulatory Visit: Payer: Self-pay

## 2013-07-14 ENCOUNTER — Encounter: Payer: Self-pay | Admitting: Podiatry

## 2013-07-14 ENCOUNTER — Ambulatory Visit (INDEPENDENT_AMBULATORY_CARE_PROVIDER_SITE_OTHER): Payer: BLUE CROSS/BLUE SHIELD | Admitting: Podiatry

## 2013-07-14 DIAGNOSIS — M2042 Other hammer toe(s) (acquired), left foot: Secondary | ICD-10-CM

## 2013-07-14 DIAGNOSIS — M79609 Pain in unspecified limb: Secondary | ICD-10-CM

## 2013-07-14 DIAGNOSIS — M79675 Pain in left toe(s): Secondary | ICD-10-CM | POA: Insufficient documentation

## 2013-07-14 DIAGNOSIS — M2040 Other hammer toe(s) (acquired), unspecified foot: Secondary | ICD-10-CM

## 2013-07-15 NOTE — Progress Notes (Signed)
Subjective: 79 year old female presents requesting toe pad for pain on left 3rd digit. She was using buttress pad that helped her to be able to walk.  Objective:  Severe contracted lesser digits with digital corn on 3rd left. No new acute lesions. Pedal pulses are all palpable.  Assessment: Pre-ulcerative distal clavi 3rd left. Hammer toe deformities 2-5 bilateral.  Plan: Palliation with digital pad. Buttress pad made and dispensed x 2. Return as needed.

## 2013-07-27 DIAGNOSIS — B9620 Unspecified Escherichia coli [E. coli] as the cause of diseases classified elsewhere: Secondary | ICD-10-CM

## 2013-07-27 DIAGNOSIS — N390 Urinary tract infection, site not specified: Secondary | ICD-10-CM

## 2013-07-27 HISTORY — DX: Urinary tract infection, site not specified: N39.0

## 2013-07-27 HISTORY — DX: Unspecified Escherichia coli (E. coli) as the cause of diseases classified elsewhere: B96.20

## 2013-07-29 ENCOUNTER — Ambulatory Visit: Payer: Self-pay | Admitting: Family Medicine

## 2013-07-29 ENCOUNTER — Telehealth: Payer: Self-pay | Admitting: Internal Medicine

## 2013-07-29 NOTE — Telephone Encounter (Signed)
Patient Information:  Caller Name: Aretha  Phone: [phone]  Patient: Ana Fisher, Ana Fisher  Gender: Female  DOB: Dec 14, 1922  Age: 79 Years  PCP: Marga Melnick  Office Follow Up:  Does the office need to follow up with this patient?: No  Instructions For The Office: N/A   Symptoms  Reason For Call & Symptoms: Pt states she wants to cancel her appt for today 07/29/13 1130, due to "stomache bug."  Reviewed Health History In EMR: Yes  Reviewed Medications In EMR: Yes  Reviewed Allergies In EMR: Yes  Reviewed Surgeries / Procedures: Yes  Date of Onset of Symptoms: 07/29/2013  Guideline(s) Used:  Diarrhea  Disposition Per Guideline:   Home Care  Reason For Disposition Reached:   Mild diarrhea  Advice Given:  Reassurance:  In healthy adults, new-onset diarrhea is usually caused by a viral infection of the intestines, which you can treat at home. Diarrhea is the body's way of getting rid of the infection. Here are some tips on how to keep ahead of the fluid losses.  Reassurance:  In healthy adults, new-onset diarrhea is usually caused by a viral infection of the intestines, which you can treat at home. Diarrhea is the body's way of getting rid of the infection. Here are some tips on how to keep ahead of the fluid losses.  Here is some care advice that should help.  Fluids:  Drink more fluids, at least 8-10 glasses (8 oz or 240 ml) daily.  For example: sports drinks, diluted fruit juices, soft drinks.  Supplement this with saltine crackers or soups to make certain that you are getting sufficient fluid and salt to meet your body's needs.  Avoid caffeinated beverages (Reason: caffeine is mildly dehydrating).  Nutrition:  Maintaining some food intake during episodes of diarrhea is important.  Ideal initial foods include boiled starches/cereals (e.g., potatoes, rice, noodles, wheat, oats) with a small amount of salt to taste.  Other acceptable foods include: bananas, yogurt, crackers,  soup.  As your stools return to normal consistency, resume a normal diet.  Diarrhea Medication  - Imodium AD:   Helps reduce diarrhea.  Expected Course:  Viral diarrhea lasts 4-7 days. Always worse on days 1 and 2.  Call Back If:  Signs of dehydration occur (e.g., no urine for more than 12 hours, very dry mouth, lightheaded, etc.)  Diarrhea lasts over 7 days  You become worse.  Patient Will Follow Care Advice:  YES

## 2013-07-29 NOTE — Telephone Encounter (Signed)
Noted. Forwarding to [employer] to verify appt is cancelled.

## 2013-08-04 ENCOUNTER — Encounter: Payer: Self-pay | Admitting: Internal Medicine

## 2013-08-04 ENCOUNTER — Ambulatory Visit (INDEPENDENT_AMBULATORY_CARE_PROVIDER_SITE_OTHER): Payer: Medicare Other | Admitting: Internal Medicine

## 2013-08-04 VITALS — BP 129/79 | HR 92 | Temp 98.4°F

## 2013-08-04 DIAGNOSIS — S34109D Unspecified injury to unspecified level of lumbar spinal cord, subsequent encounter: Secondary | ICD-10-CM

## 2013-08-04 DIAGNOSIS — R82998 Other abnormal findings in urine: Secondary | ICD-10-CM

## 2013-08-04 DIAGNOSIS — M810 Age-related osteoporosis without current pathological fracture: Secondary | ICD-10-CM

## 2013-08-04 DIAGNOSIS — Z5189 Encounter for other specified aftercare: Secondary | ICD-10-CM

## 2013-08-04 DIAGNOSIS — R8290 Unspecified abnormal findings in urine: Secondary | ICD-10-CM

## 2013-08-04 MED ORDER — TRAMADOL HCL 50 MG PO TABS: 50.0000 mg | ORAL_TABLET | Freq: Four times a day (QID) | ORAL | Status: DC | PRN

## 2013-08-04 NOTE — Patient Instructions (Addendum)
Use an anti-inflammatory cream such as Aspercreme or Zostrix cream twice a day to the affected area as needed. In lieu of this warm moist compresses or  hot water bottle can be used. Do not apply ice . Please complete the entire course of Cipro

## 2013-08-04 NOTE — Progress Notes (Signed)
  Subjective:    Patient ID: Ana Fisher, female    DOB: August 01, 1923, 79 y.o.   MRN: [id number]  HPI  She fell 07/25/13 when she overreached and became imbalanced. She fell backwards landing on her back. Since that time she said constant pain in the lumbosacral area, more so on the left than the right.  She was seen 07/27/13 at an urgent care in [facility]. Films were negative.  She was placed on Cipro for an apparent urinary tract infection.  Medical records were obtained and reviewed. Urinalysis did reveal large amount leukocytes esterase but negative nitrates.  Lumbosacral spine reveal no fractures or avulsions or dislocations. Disc spaces were well maintained. Pelvic films revealed no fractures or avulsions. The diagnosis was sprain/strain of the lumbosacral structures and possible urinary tract infection.  Tramadol and Aleve have helped the back pain; she's been hesitant to continue the tramadol.  Significant past medical history includes osteoporosis    Review of Systems The fall was unrelated to any cardiac or neurologic prodrome. Specifically she denied any change in heart rhythm or rate. She had no associated neurologic signs such as headache, limb weakness, or limb numbness or tingling.  She denies fever, chills, sweats, dysuria, pyuria, or hematuria.  She denies subsequent incontinence of urine or stool; weakness; or radicular pain in the lower extremities.     Objective:   Physical Exam She is thin but  appears adequately nourished She appears somewhat uncomfortable in no acute distress. She is sitting in a wheelchair; but she is able to stand without help. She does reach for support to prevent falling.  She appears younger than her stated age  There is no ecchymosis or change in color or temperature of skin over the area of discomfort  She localizes the discomfort to the left posterior iliac crest. There is no significant tenderness to percussion over this area or  over the lumbosacral spine itself.  Straight leg raising is negative as is range of motion of the hips..  Deep tendon reflexes are 1/2+ and equal.  Next joint changes in hands without significant deformity  Abdomen soft and nontender to palpation        Assessment & Plan:  #1 post blunt trauma to lumbosacral spine with persistent pain particularly in the left iliac crest area. Clinically fracture is not suggested based on palpation/percussion performed  #2 abnormal urine with increased lymphocytes esterase. Request was sent for the culture which was faxed. She had greater than 100,000 Escherichia coli which was indeed sensitive to Cipro. #3 history of osteoporosis  Plan: Local heat and anti-inflammatories would be recommended along with continuation of the tramadol.

## 2013-08-05 ENCOUNTER — Encounter: Payer: Self-pay | Admitting: Internal Medicine

## 2013-08-07 ENCOUNTER — Telehealth: Payer: Self-pay | Admitting: *Deleted

## 2013-08-07 NOTE — Telephone Encounter (Signed)
Please take the probiotic , Align, every day until the bowels are normal. This will replace the normal bacteria which  are necessary for formation of normal stool and processing of food. 

## 2013-08-07 NOTE — Telephone Encounter (Signed)
Message copied by Verdie Shire on Thu Aug 07, 2013  3:00 PM ------      Message from: Ana Fisher      Created: Tue Aug 05, 2013 12:33 PM       Criteria for a significant Urinary Tract Infection (UTI) are: over 100,000 colonies of a single, not multiple  bacteria. She had over 100,000 Escherichia coli which was sensitive to Cipro; she should complete the entire course. ------

## 2013-08-07 NOTE — Telephone Encounter (Signed)
Spoke with the pt and informed her of recent note below.  Pt understood and stated that she finished all the Cipro.  Pt stated that she having trouble with her bowels.. She goes from constipation to diarrhea this time she feels is coming from the Cipro.  Pt said she has had loose stools for several days then it goes to constipation.  She been taking stool softner but it is not helping.  Pt wants to know if there is something she can take regularly to help her be regular.//AB/CMA

## 2013-08-08 NOTE — Telephone Encounter (Signed)
Spoke with the pt and informed her of Dr. Frederik Pear recommendation below.  Pt understood and agreed.  Informed the pt I we can give her samples.  Pt agreed and stated that she will have someone come by and pick them up for her.  Samples put up front to be picked up for the pt.//AB/CMA

## 2013-08-08 NOTE — Telephone Encounter (Signed)
Nonoperative back specialist referral indicated rather than more films if symptoms persist or progress

## 2013-08-08 NOTE — Telephone Encounter (Signed)
Pt wanted to know if we have received her recent x-ray report from the [facility].  She wanted to know if she needs any other x-rays done.  Please advise.//AB/CMA

## 2013-08-08 NOTE — Telephone Encounter (Signed)
Spoke with the pt and informed her of Dr. Frederik Pear recommendation below.  Pt agreed and asked to cancel her appt with Dr. Alwyn Ren.  She stated she will see how she does if not any better she will schedule another appt.//AB/CMA

## 2013-08-19 ENCOUNTER — Ambulatory Visit: Payer: Self-pay | Admitting: Internal Medicine

## 2013-09-02 ENCOUNTER — Other Ambulatory Visit: Payer: Self-pay | Admitting: *Deleted

## 2013-09-02 ENCOUNTER — Telehealth: Payer: Self-pay | Admitting: Internal Medicine

## 2013-09-02 DIAGNOSIS — R269 Unspecified abnormalities of gait and mobility: Secondary | ICD-10-CM

## 2013-09-02 NOTE — Telephone Encounter (Signed)
Please advise 

## 2013-09-02 NOTE — Telephone Encounter (Signed)
Patient is calling to request an order for a walker. Her daughter is taking her to the DME supply center on Thursday.  States that she does not want to see a specialist or be referred to anyone because she says " I know what I need." Please advise.

## 2013-09-02 NOTE — Telephone Encounter (Signed)
Rx for rolling walker Dx: advanced diffuse DJD (OA)

## 2013-09-03 NOTE — Telephone Encounter (Signed)
Called and informed patient that order was at our front desk ready for her to pick up

## 2013-09-11 ENCOUNTER — Ambulatory Visit (INDEPENDENT_AMBULATORY_CARE_PROVIDER_SITE_OTHER): Payer: Self-pay | Admitting: Ophthalmology

## 2013-09-15 ENCOUNTER — Other Ambulatory Visit: Payer: Self-pay | Admitting: Internal Medicine

## 2013-09-15 NOTE — Telephone Encounter (Signed)
Furosemide refill sent to pharmacy

## 2013-09-22 ENCOUNTER — Encounter: Payer: Self-pay | Admitting: Lab

## 2013-09-23 ENCOUNTER — Ambulatory Visit (INDEPENDENT_AMBULATORY_CARE_PROVIDER_SITE_OTHER): Payer: Medicare Other | Admitting: Internal Medicine

## 2013-09-23 ENCOUNTER — Encounter: Payer: Self-pay | Admitting: Internal Medicine

## 2013-09-23 VITALS — BP 133/79 | HR 88 | Temp 97.6°F | Wt 111.8 lb

## 2013-09-23 DIAGNOSIS — M25521 Pain in right elbow: Secondary | ICD-10-CM

## 2013-09-23 DIAGNOSIS — Z9181 History of falling: Secondary | ICD-10-CM

## 2013-09-23 DIAGNOSIS — M25529 Pain in unspecified elbow: Secondary | ICD-10-CM | POA: Insufficient documentation

## 2013-09-23 DIAGNOSIS — R296 Repeated falls: Secondary | ICD-10-CM

## 2013-09-23 DIAGNOSIS — R609 Edema, unspecified: Secondary | ICD-10-CM

## 2013-09-23 DIAGNOSIS — R269 Unspecified abnormalities of gait and mobility: Secondary | ICD-10-CM

## 2013-09-23 MED ORDER — TRAMADOL HCL 50 MG PO TABS: 50.0000 mg | ORAL_TABLET | Freq: Three times a day (TID) | ORAL | Status: DC | PRN

## 2013-09-23 MED ORDER — FUROSEMIDE 20 MG PO TABS: ORAL_TABLET | ORAL | Status: DC

## 2013-09-23 NOTE — Progress Notes (Signed)
Subjective:    Patient ID: Ana Fisher, female    DOB: 1923-11-20, 79 y.o.   MRN: [id number]  HPI She has had 2 serious falls within the last several months, 07/17/13 and 08/25/13. These were mainly related to her gait instability or  imbalance when she over reaches.  She is unable to get in the bathtub;do her hair; or go to  appointments without assistance.  As of 09/02/13 a rolling walker was ordered along with a bedside commode.  Discussion has been raised about assisted living. She also has a home health care plan with [facility] which will provide an aide to come to her home and assist her in completing her activities of daily living.  Her past history was reviewed. She was actively followed in physical therapy 11/06/12-03/12/13 for her gait abnormalities  She was seen at an urgent care in August following a fall. Lumbosacral strain was diagnosed at that time. Tramadol was prescribed  She has strained the right biceps twice in the last several weeks, most recently 10/26. This is related to reaching to pick up items.    Review of Systems   The falls have not been associated with any neurologic or cardiac prodrome.  She specifically denies any change in heart rhythm or rate. She also denies associated headache, limb weakness, tingling, or numbness. She denies urinary or stool incontinence; but she is on a medication from a urologist for bladder dysfunction.  She denies fever, chills, sweats, or unexplained weight loss.  Her edema persists despite furosemide. Exercise is limited; she denies specific dyspnea on exertion. She also denies paroxysmal nocturnal dyspnea.       Objective:   Physical Exam  Gen.: Thin & frail but adequately nourished in appearance. Alert, appropriate and cooperative throughout exam.  Head: Normocephalic without obvious abnormalities Eyes: No corneal or conjunctival inflammation noted.  FOV WNL. Extraocular motion intact.  Mouth: Oral mucosa and  oropharynx reveal no lesions or exudates. Teeth in good repair. Neck: No deformities, masses, or tenderness noted. Range of motion decreased. Lungs: Normal respiratory effort; chest expands symmetrically. Lungs are clear to auscultation without rales, wheezes, or increased work of breathing. Heart: Normal rate and rhythm. Normal S1 and S2. No gallop, click, or rub. No murmur. Abdomen: Bowel sounds normal; abdomen soft and nontender. No masses, organomegaly or hernias noted.                           Musculoskeletal/extremities: Decreased spine health. Accentuated curvature of  thoracic spine.  No clubbing or cyanosis noted. Tone & strength decreased. 2+ edema to mid shin. Discomfort over the biceps with palpation. Resolving hematoma right triceps. Joints  reveal fusiform MCP changes. Nail health good.  Vascular: Carotid, radial artery pulses are full and equal.decreased dorsalis pedis and  posterior tibial pulses . No bruits present. Neurologic: Alert and oriented x3. Deep tendon reflexes symmetrical but 1/2 + @ knees. No cranial nerve deficit although she does have weakness to opposition the hands. Employing rolling walker.        Skin: Intact without suspicious lesions or rashes. Lymph: No cervical, axillary lymphadenopathy present. Psych: Mood and affect are normal. Normally interactive  Assessment & Plan:  #1 recurrent falls in the context of gait imbalance; trigger is also reaching.  Plan: Activities of daily living help will be pursued through her insurance company . She her family will discuss long-term assisted living.

## 2013-09-23 NOTE — Patient Instructions (Signed)
Followup as needed for your this acute issue. Please report any significant change in your symptoms.  I recommend a  Consultation with Dr Lavell Islam to determine optimal therapy for the arm pain.Use an anti-inflammatory cream such as Aspercreme or Zostrix cream twice a day to the right arm as needed. In lieu of this warm moist compresses or  hot water bottle can be used. Do not apply ice .

## 2013-10-02 ENCOUNTER — Other Ambulatory Visit: Payer: Self-pay

## 2013-10-09 ENCOUNTER — Ambulatory Visit: Payer: Self-pay

## 2013-10-10 ENCOUNTER — Telehealth: Payer: Self-pay | Admitting: Internal Medicine

## 2013-10-10 NOTE — Telephone Encounter (Signed)
Patient's daughter is calling to request a referral to be sent to [facility]. She is wanting patient to be assessed for PT, OT and nursing services. Please advise. Fax#: [phone]

## 2013-10-14 ENCOUNTER — Telehealth: Payer: Self-pay | Admitting: Internal Medicine

## 2013-10-14 ENCOUNTER — Other Ambulatory Visit: Payer: Self-pay | Admitting: *Deleted

## 2013-10-14 DIAGNOSIS — IMO0001 Reserved for inherently not codable concepts without codable children: Secondary | ICD-10-CM

## 2013-10-14 DIAGNOSIS — Z742 Need for assistance at home and no other household member able to render care: Secondary | ICD-10-CM

## 2013-10-14 NOTE — Telephone Encounter (Signed)
OK but hopefully she has not exhausted insurance PT/OT benefits

## 2013-10-14 NOTE — Telephone Encounter (Signed)
Melissa with [facility] called and states that they are unable to see this patient due to the availability of staffing in her area that is allowable with her insurance plan.

## 2013-10-14 NOTE — Telephone Encounter (Signed)
Referral entered and faxed

## 2013-10-14 NOTE — Telephone Encounter (Signed)
Called and spoke with patients daughter. She is going to call [facility] back because she states that her mom lives right near them. She stated she would research other places if it doesn't work out with [facility]. She is going to give [place] a call back.

## 2013-10-14 NOTE — Telephone Encounter (Signed)
Is it ok to make referral ?

## 2013-10-23 ENCOUNTER — Encounter: Payer: Self-pay | Admitting: Internal Medicine

## 2013-10-23 DIAGNOSIS — R29898 Other symptoms and signs involving the musculoskeletal system: Secondary | ICD-10-CM | POA: Insufficient documentation

## 2013-10-23 DIAGNOSIS — R296 Repeated falls: Secondary | ICD-10-CM | POA: Insufficient documentation

## 2013-10-27 NOTE — Telephone Encounter (Signed)
Gie with [facility] is calling to let us know that he went out to the patient's house to start her PT services but patient was confused and told them that she thought they were coming tomorrow. So there will be a delay in start of care, per pt request, until tomorrow. No call back requested.

## 2013-10-31 ENCOUNTER — Telehealth: Payer: Self-pay | Admitting: Internal Medicine

## 2013-10-31 NOTE — Telephone Encounter (Signed)
Chandni called from [facility] to discuss Plan of Treatment for [facility].  Physical Therapy will be 1 time for two weeks, next one will be 2 times for two weeks and finally 1 time for 1 week.   Occupational Therapy will start next week.

## 2013-10-31 NOTE — Telephone Encounter (Signed)
Please advise.//AB/CMA 

## 2013-11-06 NOTE — Telephone Encounter (Signed)
Ok to give go ahead?

## 2013-11-06 NOTE — Telephone Encounter (Signed)
LVM for [facility]

## 2013-11-06 NOTE — Telephone Encounter (Signed)
OK  I am sorry; this is the first I received your message. I will discuss this with our Estate agent.

## 2013-11-06 NOTE — Telephone Encounter (Signed)
Spoke with [employer]. Treatment plan as indicated. Enacted verbal per pcp

## 2013-11-06 NOTE — Telephone Encounter (Signed)
Ana Fisher from [name] called with Plan of Care for his occupational therapy. POC: 2x a week for 2 weeks, 1x a week for 3 weeks and this is for transfers, ADL, functional mobility, IADL, adaptive equipment, balance, and home exercise program strengthening. Ok to discharge when goals are met.  [name] states that he has left two VM's on the triage line VM.  He can be reached back at [phone]. Please advise.

## 2013-11-17 ENCOUNTER — Telehealth: Payer: Self-pay | Admitting: *Deleted

## 2013-11-17 DIAGNOSIS — Z9181 History of falling: Secondary | ICD-10-CM

## 2013-11-17 DIAGNOSIS — R269 Unspecified abnormalities of gait and mobility: Secondary | ICD-10-CM

## 2013-11-17 NOTE — Telephone Encounter (Signed)
Physician plan of care certification orders signed and faxed to [facility] attn: Suezanne Cheshire

## 2013-11-19 ENCOUNTER — Telehealth: Payer: Self-pay

## 2013-11-19 ENCOUNTER — Other Ambulatory Visit: Payer: Self-pay | Admitting: *Deleted

## 2013-11-19 NOTE — Telephone Encounter (Signed)
Forms to be completed for insurance to cover Home Health Care. Completed and faxed to requested number.

## 2013-11-28 NOTE — Telephone Encounter (Signed)
11/28/2013  Copy sent to batch, copy sent to billing.  bw 

## 2013-12-03 ENCOUNTER — Encounter: Payer: Self-pay | Admitting: Internal Medicine

## 2013-12-03 ENCOUNTER — Ambulatory Visit (INDEPENDENT_AMBULATORY_CARE_PROVIDER_SITE_OTHER): Payer: Medicare HMO | Admitting: Internal Medicine

## 2013-12-03 VITALS — BP 117/71 | HR 85 | Temp 97.7°F | Resp 16 | Wt 117.0 lb

## 2013-12-03 DIAGNOSIS — R609 Edema, unspecified: Secondary | ICD-10-CM

## 2013-12-03 DIAGNOSIS — M25529 Pain in unspecified elbow: Secondary | ICD-10-CM

## 2013-12-03 LAB — HEPATIC FUNCTION PANEL
ALBUMIN: 3.7 g/dL (ref 3.5–5.2)
ALT: 23 U/L (ref 0–35)
AST: 27 U/L (ref 0–37)
Alkaline Phosphatase: 60 U/L (ref 39–117)
Bilirubin, Direct: 0.1 mg/dL (ref 0.0–0.3)
TOTAL PROTEIN: 6.4 g/dL (ref 6.0–8.3)
Total Bilirubin: 0.5 mg/dL (ref 0.3–1.2)

## 2013-12-03 LAB — BASIC METABOLIC PANEL
BUN: 16 mg/dL (ref 6–23)
CHLORIDE: 101 meq/L (ref 96–112)
CO2: 30 meq/L (ref 19–32)
CREATININE: 0.6 mg/dL (ref 0.4–1.2)
Calcium: 9.5 mg/dL (ref 8.4–10.5)
GFR: 94.67 mL/min (ref >60.00)
GLUCOSE: 55 mg/dL — AB (ref 70–99)
Potassium: 3.6 mEq/L (ref 3.5–5.1)
Sodium: 138 mEq/L (ref 135–145)

## 2013-12-03 LAB — BRAIN NATRIURETIC PEPTIDE: PRO B NATRI PEPTIDE: 145 pg/mL — AB (ref 0.0–100.0)

## 2013-12-03 MED ORDER — FUROSEMIDE 20 MG PO TABS: ORAL_TABLET | ORAL | Status: DC

## 2013-12-03 NOTE — Progress Notes (Signed)
Pre-visit discussion using our clinic review tool. No additional management support is needed unless otherwise documented below in the visit note.  

## 2013-12-03 NOTE — Progress Notes (Signed)
   Subjective:    Patient ID: Ana Fisher, female    DOB: [birth year], 80 y.o.   MRN: [id number]  HPI She is here for med refill of Lasix.Sodium restricted.Blood pressure range 116/60-138/78 as per Ana Fisher.  On no anti hypertemsive medication. Some lightheadedness with falls.  DOE post ? 15 yards; no paroxysmal nocturnal dyspnea. Edema unchanged; wrapping not tolerating.  Last renal and hepatic function records in June 2013. No current BNP, chest x-ray, or ECHO     Review of Systems  Significant headaches, epistaxis, chest pain, palpitations, or claudication absent.  She has intermittent discomfort in the right upper arm, deltoid/shoulder area. This is worse with overuse or elevation of arm above shoulder level. This is been present for at least a year.    Objective:   Physical Exam Appears thin but  adequately nourished & in no acute distress.Generalized muscle wasting suggested.  ? Faint R  carotid bruit present.No neck pain distention present sitting. Thyroid normal to palpation  Heart rhythm and rate are normal with no significant murmurs or gallops.  Chest : minimal rales  There is no evidence of aortic aneurysm or renal artery bruits  Abdomen soft with no organomegaly or masses. No HJR  No clubbing or cyanosis .Tense edema to upper shin present.  Pedal pulses are decreased by edema  No ischemic skin changes are present . Finger nails healthy  Alert and oriented. Strength, tone decreased.  She is able to raise arms above her head bilaterally. She has mild crepitus with range of motion testing, right greater than left.          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

## 2013-12-03 NOTE — Patient Instructions (Signed)
Order for x-rays entered into  the computer; these will be performed at [place]. across from [facility]. No appointment is necessary. Your next office appointment will be determined based upon review of your pending labs &/ or x-rays. Those instructions will be transmitted to you through My Chart . Use an anti-inflammatory cream such as Aspercreme or Zostrix cream twice a day to the affected area as needed. In lieu of this warm moist compresses or  hot water bottle can be used. Do not apply ice .

## 2013-12-03 NOTE — Assessment & Plan Note (Signed)
Use an anti-inflammatory cream such as Aspercreme or Zostrix cream twice a day to the affected area as needed. In lieu of this warm moist compresses or  hot water bottle can be used. Do not apply ice . Ortho referral

## 2013-12-15 ENCOUNTER — Ambulatory Visit (HOSPITAL_COMMUNITY): Payer: Medicare HMO

## 2013-12-22 ENCOUNTER — Ambulatory Visit (HOSPITAL_COMMUNITY)
Admission: RE | Admit: 2013-12-22 | Discharge: 2013-12-22 | Disposition: A | Discharge status: Home / Self Care | Payer: Medicare HMO | Source: Ambulatory Visit | Attending: Internal Medicine | Admitting: Internal Medicine

## 2013-12-22 DIAGNOSIS — R609 Edema, unspecified: Secondary | ICD-10-CM | POA: Insufficient documentation

## 2013-12-22 DIAGNOSIS — I519 Heart disease, unspecified: Secondary | ICD-10-CM

## 2013-12-22 NOTE — Progress Notes (Signed)
Echocardiogram 2D Echocardiogram has been performed.  Ana Fisher 12/22/2013, 12:23 PM

## 2014-01-15 ENCOUNTER — Other Ambulatory Visit: Payer: Self-pay | Admitting: Internal Medicine

## 2014-01-15 ENCOUNTER — Telehealth: Payer: Self-pay | Admitting: Internal Medicine

## 2014-01-15 DIAGNOSIS — R609 Edema, unspecified: Secondary | ICD-10-CM

## 2014-01-15 NOTE — Telephone Encounter (Signed)
Patient called stating Ana Fisher suggested that she have physical therapy for her swollen feet. Patient agrees that she needs therapy, but needs a written order. [facility] located [place], [place] [address]. Patient would like to have a written copy in her hand to take to them next week.

## 2014-02-02 ENCOUNTER — Ambulatory Visit: Payer: Self-pay | Admitting: Internal Medicine

## 2014-02-03 ENCOUNTER — Encounter: Payer: Self-pay | Admitting: Internal Medicine

## 2014-02-03 ENCOUNTER — Ambulatory Visit (INDEPENDENT_AMBULATORY_CARE_PROVIDER_SITE_OTHER): Payer: Medicare HMO | Admitting: Internal Medicine

## 2014-02-03 ENCOUNTER — Ambulatory Visit (INDEPENDENT_AMBULATORY_CARE_PROVIDER_SITE_OTHER)
Admission: RE | Admit: 2014-02-03 | Discharge: 2014-02-03 | Disposition: A | Discharge status: Home / Self Care | Payer: Medicare HMO | Source: Ambulatory Visit | Attending: Internal Medicine | Admitting: Internal Medicine

## 2014-02-03 VITALS — BP 120/60 | HR 86 | Temp 96.5°F | Resp 16 | Wt 122.0 lb

## 2014-02-03 DIAGNOSIS — S4980XA Other specified injuries of shoulder and upper arm, unspecified arm, initial encounter: Secondary | ICD-10-CM

## 2014-02-03 DIAGNOSIS — I519 Heart disease, unspecified: Secondary | ICD-10-CM

## 2014-02-03 DIAGNOSIS — R609 Edema, unspecified: Secondary | ICD-10-CM

## 2014-02-03 DIAGNOSIS — S4991XA Unspecified injury of right shoulder and upper arm, initial encounter: Secondary | ICD-10-CM

## 2014-02-03 DIAGNOSIS — S46909A Unspecified injury of unspecified muscle, fascia and tendon at shoulder and upper arm level, unspecified arm, initial encounter: Secondary | ICD-10-CM

## 2014-02-03 DIAGNOSIS — I5189 Other ill-defined heart diseases: Secondary | ICD-10-CM

## 2014-02-03 NOTE — Progress Notes (Signed)
Pre visit review using our clinic review tool, if applicable. No additional management support is needed unless otherwise documented below in the visit note. 

## 2014-02-03 NOTE — Progress Notes (Signed)
   Subjective:    Patient ID: Ana Fisher, female    DOB: [birth year], 80 y.o.   MRN: [id number]  HPI  She was seen in early January to evaluate the significant peripheral edema. Her symptoms have actually progressed recently; she states she's gained 10 pounds. She denies intake of significant sodium. A heart healthy /low salt diet is followed.  According to her daughter, there was no response to 60 mg of Lasix.  Evaluation of the edema included extensive lab studies. Her glucose was mildly low at 55. Most importantly creatinine 0.6 and GFR 94.67. Hepatic function was totally normal. Total protein was low normal at 6.4.  Her BNP was minimally elevated at 145. Her echocardiogram showed a normal ejection fraction of 55-60%. There was suggestion of grade 1 diastolic dysfunction.  She had venous Dopplers performed 05/04/12 and 08/01/12. No deep venous thrombosis was present. There is no popliteal cyst present. On the second study there was suggestion of breakdown his main reflux.     Review of Systems Significant chest pain, palpitations, claudication,or paroxysmal nocturnal dyspnea absent. Some DOE after 15 feet.  She was seeing Dr. Noemi Chapel for therapy of a previous upper arm injury. He is no longer on her plan. At one of his visits, the PA suggested the lower extremity wrapping as well. Because of logistics this was not pursued  She fell 01/30/14 reentering the right arm. She sustained bruises over the biceps area. At this time she is unable to lift the right upper extremity.     Objective:   Physical Exam  Appears frail ; in W/C. Adequately nourished & in no acute distress  No carotid bruits are present.No neck pain distention present sitting. Thyroid normal to palpation  Heart rhythm and rate are normal with no gallop or murmur  Chest is clear with no increased work of breathing  There is no evidence of aortic aneurysm or renal artery bruits  Abdomen soft with no organomegaly or  masses. No HJR  No clubbing or cyanosis present. Pitting edema is present bilaterally up to the knees.  Pedal pulses are decreased due to the edema.  Venous spiders over the feet  No ischemic skin changes are present . Fingernails/ toenails healthy   Alert and oriented.   Diffuse bruising over the right inferior biceps area. She is unable to raise the right arm        Assessment & Plan:  #1 intractable, progressive lower extremity edema  #2 diastolic dysfunction  #3 recurrent injury right upper extremity; rule out rotator cuff tear   Plan: Cardiology referral to assess the diastolic dysfunction. Chest x-ray.  Lymphedema type therapy with wrapping may be necessary.  Orthopedic referral

## 2014-02-03 NOTE — Patient Instructions (Signed)
The Cardiology referral will be scheduled and you'll be notified of the time. 

## 2014-02-03 NOTE — Assessment & Plan Note (Signed)
Cardiology referral  

## 2014-02-05 ENCOUNTER — Telehealth: Payer: Self-pay | Admitting: Internal Medicine

## 2014-02-05 ENCOUNTER — Other Ambulatory Visit: Payer: Self-pay | Admitting: Internal Medicine

## 2014-02-05 DIAGNOSIS — M79621 Pain in right upper arm: Secondary | ICD-10-CM

## 2014-02-05 DIAGNOSIS — M898X2 Other specified disorders of bone, upper arm: Secondary | ICD-10-CM

## 2014-02-05 NOTE — Telephone Encounter (Signed)
Order entered

## 2014-02-05 NOTE — Telephone Encounter (Signed)
Patient called stating her right shoulder is getting worse and would like an order for an xray at [facility]. Please advise.

## 2014-02-06 ENCOUNTER — Ambulatory Visit (INDEPENDENT_AMBULATORY_CARE_PROVIDER_SITE_OTHER)
Admission: RE | Admit: 2014-02-06 | Discharge: 2014-02-06 | Disposition: A | Discharge status: Home / Self Care | Payer: Medicare HMO | Source: Ambulatory Visit | Attending: Internal Medicine | Admitting: Internal Medicine

## 2014-02-06 ENCOUNTER — Telehealth: Payer: Self-pay | Admitting: *Deleted

## 2014-02-06 DIAGNOSIS — M79609 Pain in unspecified limb: Secondary | ICD-10-CM

## 2014-02-06 DIAGNOSIS — M79621 Pain in right upper arm: Secondary | ICD-10-CM

## 2014-02-06 DIAGNOSIS — M898X2 Other specified disorders of bone, upper arm: Secondary | ICD-10-CM

## 2014-02-06 NOTE — Telephone Encounter (Signed)
Spoke with the pt and informed her that the x-ray has been ordered and she can come and have the x-ray done.  Pt understood and agreed.//AB/CMA

## 2014-02-06 NOTE — Telephone Encounter (Signed)
Patient phoned requesting test results from 02/06/14.  CB# [phone]

## 2014-02-18 ENCOUNTER — Ambulatory Visit: Payer: Self-pay | Admitting: Internal Medicine

## 2014-02-25 ENCOUNTER — Ambulatory Visit: Payer: Medicare HMO | Admitting: Internal Medicine

## 2014-03-02 ENCOUNTER — Ambulatory Visit (INDEPENDENT_AMBULATORY_CARE_PROVIDER_SITE_OTHER): Payer: Medicare HMO | Admitting: Cardiovascular Disease

## 2014-03-02 ENCOUNTER — Encounter: Payer: Self-pay | Admitting: Cardiovascular Disease

## 2014-03-02 VITALS — BP 88/52 | HR 76 | Ht 61.5 in | Wt 121.1 lb

## 2014-03-02 DIAGNOSIS — I519 Heart disease, unspecified: Secondary | ICD-10-CM

## 2014-03-02 DIAGNOSIS — I5189 Other ill-defined heart diseases: Secondary | ICD-10-CM

## 2014-03-02 DIAGNOSIS — R609 Edema, unspecified: Secondary | ICD-10-CM

## 2014-03-02 NOTE — Patient Instructions (Addendum)
Your physician recommends that you schedule a follow-up appointment in: as needed basis   Your physician recommends that you continue on your current medications as directed. Please refer to the Current Medication list given to you today.   I have recommended that you elevate your legs.  An ideal way is to get a Lounge Doctor Leg rest - invented by one of our local vascular surgeons.  Website:  [email or web address]

## 2014-03-02 NOTE — Assessment & Plan Note (Signed)
Mrs. Ana Fisher is seen today for diastolic dysfunction. I have reviewed her echocardiogram personally. She does have mild diastolic dysfunction I do not think that this is the cause of any of her problems. She's 80 years old and has had varicose veins for years. She has marked leg edema and also has ascites. I suspect that this is more to do with age and the capillaries rather than her diastolic dysfunction. Her right ventricle is normal size and she does not have pulmonary hypertension.  She's had edema for the past 5 or 6 years. Her overall condition has gradually declined over the past 2-3  Years.  She's lost about 20 pounds.  Not long discussion about treatment of her edema. I suggested that these conservative methods such as the Lounge Doctor leg rest designed by Dr. Gae Gallop.  She may need referral to physical therapy to have her legs wrapped. I do not think that she needs additional Lasix therapy. I think that the Lasix to work much better she gets her legs elevated and allow some of this fluid to get back into her central circulation.  She does report having some chest pain and shortness breath with exertion.  At 80 years old,I  suspectshe may have coronary artery disease but I doubt  that it will be anything that is treatable with PCI. I Think that our best approach is to treat her conservatively. I would not anticipate that she would not  benefit from an invasive procedure. She is quite frail and her condition does not appear to be strong enough to really go through an ischemic evaluation / cardiac cath . Her left ventricle systolic function is well-preserved at this time.  We'll see her on an as-needed basis. I've had long discussion with the patient and her daughter, Jeris Penta.

## 2014-03-02 NOTE — Progress Notes (Signed)
Ana Fisher Date of Birth  01-19-1923       [facility]    [employer] [address], [address], [place], [place]  [address]   [place], [place]  [address] [address]   Fax  [phone]     Fax [phone]  Problem List: 1. Chronic diastolic dysfunction 2. Breast cancer - s/p bilateral mastectomies   History of Present Illness:  Ana Fisher is an 80 y.o. female with leg swelling for the 5-6  years.    It has progressively worsened - especially since she stopped working - was previously at [employer].  Chronic dyspnea. Former smoker.  Quit 40 years ago.  no syncope.  She does have smptoms of orthostasis.  She has some episodes of CP / aches, occurs at anytime. Not particularly worsened by exercise.  Does get dyspneac with walking.   She was given lasix which has seemed to help much.  She has to wake up many times at night to go to the bathroom.    Current Outpatient Prescriptions  Medication Sig Dispense Refill  . aspirin EC 81 MG tablet Take 81 mg by mouth every other day.       . Calcium Carbonate (CALTRATE 600 PO) Take by mouth 2 (two) times daily.        . furosemide (LASIX) 20 MG tablet 60 mg daily.      . naproxen sodium (ANAPROX) 220 MG tablet Take 220 mg by mouth as needed.      . Omega-3 Fatty Acids (FISH OIL) 1200 MG CAPS Take by mouth 2 (two) times daily.        . traMADol (ULTRAM) 50 MG tablet Take 1 tablet (50 mg total) by mouth every 8 (eight) hours as needed for pain.  30 tablet  0  . Trospium Chloride (SANCTURA XR) 60 MG CP24 Take 1 capsule by mouth daily.       No current facility-administered medications for this visit.     Allergies  Allergen Reactions  . Penicillins     Hives Because of a history of documented adverse serious drug reaction;Medi Alert bracelet  is recommended  . Sulfonamide Derivatives     She denies allergy to Sulfa    Past Medical History  Diagnosis Date  .  Goiter, nontoxic, multinodular   . Urinary incontinence   . Ankle edema 2009  . Constipation   . Hx of colonic polyps   . Osteoporosis   . Breast cancer   . Escherichia coli urinary tract infection 07/27/13    Cipro prescribed by [facility] urgent care    Past Surgical History  Procedure Laterality Date  . Appendectomy    . Colonoscopy w/ polypectomy    . Abdominal hysterectomy    . Bilateral salpingoophorectomy    . Mastectomy      bilateral  . Tonsillectomy    . Total knee arthroplasty    . Cataract extraction, bilateral      History  Smoking status  . Former Smoker  . Quit date: 11/27/1982  Smokeless tobacco  . Never Used    History  Alcohol Use  . Yes    Comment: wine occasionally    Family History  Problem Relation Age of Onset  . Cancer Father     lung cancer  . Aortic aneurysm Brother   . Hypertension Mother   . Cancer Daughter     Reviw of  Systems:  Reviewed in the HPI.  All other systems are negative.  Physical Exam: Blood pressure 88/52, pulse 76, height 5' 1.5" (1.562 m), weight 121 lb 1.9 oz (54.94 kg). Wt Readings from Last 3 Encounters:  03/02/14 121 lb 1.9 oz (54.94 kg)  02/03/14 122 lb 0.6 oz (55.357 kg)  12/03/13 117 lb (53.071 kg)     General: chronically ill appearing,  Wasted, cachetic,   Very bright and alert.  Participated in the visit with lots of understanding.   Head: Normocephalic, atraumatic, sclera non-icteric, mucus membranes are moist,   Neck: Supple. Carotids are 2 + without bruits. Mild JVD with small V waves  Lungs: Clear   Heart: RR, soft systolic murmur,  Chest wall is very bony.  She has lots lots of muscle mass.    Abdomen: Soft, non-tender, mild distended.    Msk:  Strength and tone are normal   Extremities: 3+ leg edema,  Skin is tense.   Neuro: CN II - XII intact.  Alert and oriented X 3.   Psych:  Normal   ECG: March 02, 2014:  NSR at 71.  LAE, poor R wave progression  Assessment / Plan:

## 2014-03-18 ENCOUNTER — Ambulatory Visit (INDEPENDENT_AMBULATORY_CARE_PROVIDER_SITE_OTHER)
Admission: RE | Admit: 2014-03-18 | Discharge: 2014-03-18 | Disposition: A | Discharge status: Home / Self Care | Payer: Medicare HMO | Source: Ambulatory Visit | Attending: Internal Medicine | Admitting: Internal Medicine

## 2014-03-18 ENCOUNTER — Ambulatory Visit (INDEPENDENT_AMBULATORY_CARE_PROVIDER_SITE_OTHER): Payer: Medicare HMO | Admitting: Internal Medicine

## 2014-03-18 ENCOUNTER — Encounter: Payer: Self-pay | Admitting: Internal Medicine

## 2014-03-18 ENCOUNTER — Other Ambulatory Visit (INDEPENDENT_AMBULATORY_CARE_PROVIDER_SITE_OTHER): Payer: Medicare HMO

## 2014-03-18 ENCOUNTER — Other Ambulatory Visit: Payer: Self-pay | Admitting: Internal Medicine

## 2014-03-18 VITALS — BP 128/68 | HR 87 | Temp 96.8°F | Resp 15 | Wt 114.0 lb

## 2014-03-18 DIAGNOSIS — R609 Edema, unspecified: Secondary | ICD-10-CM

## 2014-03-18 DIAGNOSIS — R0609 Other forms of dyspnea: Secondary | ICD-10-CM

## 2014-03-18 DIAGNOSIS — R071 Chest pain on breathing: Secondary | ICD-10-CM

## 2014-03-18 DIAGNOSIS — R0789 Other chest pain: Secondary | ICD-10-CM

## 2014-03-18 DIAGNOSIS — R0989 Other specified symptoms and signs involving the circulatory and respiratory systems: Secondary | ICD-10-CM

## 2014-03-18 DIAGNOSIS — I5189 Other ill-defined heart diseases: Secondary | ICD-10-CM

## 2014-03-18 DIAGNOSIS — R634 Abnormal weight loss: Secondary | ICD-10-CM

## 2014-03-18 DIAGNOSIS — M546 Pain in thoracic spine: Secondary | ICD-10-CM

## 2014-03-18 DIAGNOSIS — J90 Pleural effusion, not elsewhere classified: Secondary | ICD-10-CM

## 2014-03-18 DIAGNOSIS — I519 Heart disease, unspecified: Secondary | ICD-10-CM

## 2014-03-18 LAB — BASIC METABOLIC PANEL
BUN: 22 mg/dL (ref 6–23)
CO2: 28 mEq/L (ref 19–32)
Calcium: 9.8 mg/dL (ref 8.4–10.5)
Chloride: 95 mEq/L — ABNORMAL LOW (ref 96–112)
Creatinine, Ser: 0.7 mg/dL (ref 0.4–1.2)
GFR: 82.42 mL/min (ref >60.00)
GLUCOSE: 84 mg/dL (ref 70–99)
POTASSIUM: 3.7 meq/L (ref 3.5–5.1)
Sodium: 134 mEq/L — ABNORMAL LOW (ref 135–145)

## 2014-03-18 LAB — HEPATIC FUNCTION PANEL
ALBUMIN: 3.6 g/dL (ref 3.5–5.2)
ALK PHOS: 113 U/L (ref 39–117)
ALT: 20 U/L (ref 0–35)
AST: 33 U/L (ref 0–37)
BILIRUBIN DIRECT: 0.1 mg/dL (ref 0.0–0.3)
Total Bilirubin: 0.8 mg/dL (ref 0.3–1.2)
Total Protein: 6.9 g/dL (ref 6.0–8.3)

## 2014-03-18 LAB — CBC WITH DIFFERENTIAL/PLATELET
BASOS ABS: 0 10*3/uL (ref 0.0–0.1)
Basophils Relative: 0.2 % (ref 0.0–3.0)
EOS ABS: 0 10*3/uL (ref 0.0–0.7)
Eosinophils Relative: 0.2 % (ref 0.0–5.0)
HEMATOCRIT: 40.5 % (ref 36.0–46.0)
Hemoglobin: 13.3 g/dL (ref 12.0–15.0)
LYMPHS ABS: 2.2 10*3/uL (ref 0.7–4.0)
Lymphocytes Relative: 22.4 % (ref 12.0–46.0)
MCHC: 32.9 g/dL (ref 30.0–36.0)
MCV: 90.4 fl (ref 78.0–100.0)
Monocytes Absolute: 1 10*3/uL (ref 0.1–1.0)
Monocytes Relative: 9.9 % (ref 3.0–12.0)
Neutro Abs: 6.6 10*3/uL (ref 1.4–7.7)
Neutrophils Relative %: 67.3 % (ref 43.0–77.0)
PLATELETS: 214 10*3/uL (ref 150.0–400.0)
RBC: 4.48 Mil/uL (ref 3.87–5.11)
RDW: 14.5 % (ref 11.5–14.6)
WBC: 9.8 10*3/uL (ref 4.5–10.5)

## 2014-03-18 LAB — BRAIN NATRIURETIC PEPTIDE: Pro B Natriuretic peptide (BNP): 159 pg/mL — ABNORMAL HIGH (ref 0.0–100.0)

## 2014-03-18 LAB — TSH: TSH: 1.17 u[IU]/mL (ref 0.35–5.50)

## 2014-03-18 NOTE — Patient Instructions (Signed)
Your next office appointment will be determined based upon review of your pending labs  x-rays. Those instructions will be transmitted to you through My Chart  OR  by mail;whichever process is your choice to receive results & recommendations .Followup as needed for your acute issue. Please report any significant change in your symptoms. 

## 2014-03-18 NOTE — Assessment & Plan Note (Signed)
Renal & hepatic function BNP CXray Compression interventions discussed again

## 2014-03-18 NOTE — Progress Notes (Signed)
   Subjective:    Patient ID: Ana Fisher, female    DOB: [birth year], 80 y.o.   MRN: [id number]  HPI  She is here to discuss 5 problems:   1. Pain anterior to L hip with movement for approximately one month; improves with sitting 2. Pain just below R breast, constant, for approximately one month. Keeps her up at night.   3. Pain in spine at T6-T9 since recent weight loss of 10 pounds within last 2 weeks.  4. Increased bilateral LLE edema, with claudication and burning sensation in legs, within last 2 weeks.  5. Increased dyspnea with exertion within last month.   She reports increased nocturia and decreased appetite. She denies recent falls or trauma.   Renal and liver function labs from 12/03/13 were WNL.   Review of Systems Denies chest pain, dyspnea at rest, palpitations, unusual bruising or bleeding.   Denies fever, chills, sweats, nausea, vomiting, diarrhea, increased thirst and hunger.  Denies significant  dyspepsia, dysphagia, unexplained weight loss, abdominal pain, melena, rectal bleeding, or small caliber stools.    Objective:   Physical Exam Appears frail; no acute distress. RW for ambulation.   No carotid bruits are present.   Heart rhythm and rate normal;   Chest is clear with no increased work of breathing  There is no evidence of aortic aneurysm or renal artery bruits  Abdomen soft with no organomegaly or masses. No HJR  No clubbing, cyanosis or edema present.  Pedal pulses are decreased. Significant edema up to thighs.   No ischemic skin changes are present . Fingernails/ toenails healthy   Alert and oriented. Strength, tone, DTRs reflexes normal       Assessment & Plan:

## 2014-03-18 NOTE — Progress Notes (Signed)
   Subjective:    Patient ID: Ana Fisher, female    DOB: 1923/06/21, 80 y.o.   MRN: [id number]  HPI She is here to discuss 5 problems:  1. Pain anterior to L hip with movement for approximately one month; improves with sitting  2. Pain just below R breast, constant, for approximately one month. Keeps her up at night. Worse sitting & leaning forward 3. Pain in spine at T6-T9 since recent weight loss of 10 pounds within last 2 weeks. Worse with pressure of chair on back 4. Increased bilateral LLE edema, with claudication and burning sensation in legs, within last 2 weeks.  5. Increased dyspnea with exertion within last month.  She reports increased nocturia and decreased appetite. She denies recent falls or trauma.   Renal and liver function labs from 12/03/13 were WNL.    Review of Systems Denies chest pain, dyspnea at rest, palpitations, unusual bruising or bleeding.  Denies fever, chills, sweats, nausea, vomiting, diarrhea, increased thirst and hunger.  Denies significant dyspepsia, dysphagia, unexplained weight loss, abdominal pain, melena, rectal bleeding, or small caliber stools.      Objective:   Physical Exam Appears frail; no acute distress. RW for ambulation.  No neck or axillary LA Thyroid not enlarged No carotid bruits are present.  Heart rhythm and rate normal;  Chest is clear with no increased work of breathing  There is no evidence of aortic aneurysm or renal artery bruits  Abdomen soft with no organomegaly or masses. No HJR  No clubbing or cyanosis present.  Pedal pulses are decreased. Significant edema up to thighs, 2-3+.   Homans sign negative No ischemic skin changes are present . Fingernails healthy  Alert and oriented. Strength, tone generally decreased     Assessment & Plan:  #1 left hip area pain  #2 right inframammary chest pain, posturally related  #3 spine pain  #4 weight loss  #5 progressive left lower extremity edema  #6 exertional  dyspnea  See orders

## 2014-03-18 NOTE — Progress Notes (Signed)
Pre visit review using our clinic review tool, if applicable. No additional management support is needed unless otherwise documented below in the visit note. 

## 2014-03-19 ENCOUNTER — Other Ambulatory Visit: Payer: Self-pay | Admitting: Internal Medicine

## 2014-03-19 ENCOUNTER — Telehealth: Payer: Self-pay

## 2014-03-19 ENCOUNTER — Ambulatory Visit (HOSPITAL_COMMUNITY): Payer: Medicare HMO | Attending: Internal Medicine | Admitting: Cardiology

## 2014-03-19 ENCOUNTER — Encounter: Payer: Self-pay | Admitting: Internal Medicine

## 2014-03-19 DIAGNOSIS — M7989 Other specified soft tissue disorders: Secondary | ICD-10-CM

## 2014-03-19 DIAGNOSIS — R791 Abnormal coagulation profile: Secondary | ICD-10-CM

## 2014-03-19 DIAGNOSIS — R0602 Shortness of breath: Secondary | ICD-10-CM

## 2014-03-19 DIAGNOSIS — R609 Edema, unspecified: Secondary | ICD-10-CM

## 2014-03-19 DIAGNOSIS — R7989 Other specified abnormal findings of blood chemistry: Secondary | ICD-10-CM

## 2014-03-19 LAB — D-DIMER, QUANTITATIVE: D-Dimer, Quant: 3.64 ug/mL-FEU — ABNORMAL HIGH (ref 0.00–0.48)

## 2014-03-19 NOTE — Telephone Encounter (Signed)
Order needed to be re-done due to incorrect expiration date

## 2014-03-19 NOTE — Telephone Encounter (Signed)
Opened in error

## 2014-03-19 NOTE — Progress Notes (Signed)
Lower venous duplex bilateral complete 

## 2014-03-19 NOTE — Telephone Encounter (Signed)
orders

## 2014-03-25 ENCOUNTER — Encounter (HOSPITAL_COMMUNITY): Payer: Self-pay

## 2014-03-27 ENCOUNTER — Emergency Department (HOSPITAL_COMMUNITY): Payer: Medicare HMO

## 2014-03-27 ENCOUNTER — Inpatient Hospital Stay (HOSPITAL_COMMUNITY): Payer: Medicare HMO

## 2014-03-27 ENCOUNTER — Telehealth: Payer: Self-pay

## 2014-03-27 ENCOUNTER — Inpatient Hospital Stay (HOSPITAL_COMMUNITY)
Admission: EM | Admit: 2014-03-27 | Discharge: 2014-04-27 | DRG: 388 | Disposition: E | Payer: Medicare HMO | Attending: Internal Medicine | Admitting: Internal Medicine

## 2014-03-27 ENCOUNTER — Encounter (HOSPITAL_COMMUNITY): Payer: Self-pay | Admitting: Emergency Medicine

## 2014-03-27 DIAGNOSIS — E43 Unspecified severe protein-calorie malnutrition: Secondary | ICD-10-CM | POA: Diagnosis present

## 2014-03-27 DIAGNOSIS — M25559 Pain in unspecified hip: Secondary | ICD-10-CM

## 2014-03-27 DIAGNOSIS — K56609 Unspecified intestinal obstruction, unspecified as to partial versus complete obstruction: Secondary | ICD-10-CM | POA: Diagnosis present

## 2014-03-27 DIAGNOSIS — R109 Unspecified abdominal pain: Secondary | ICD-10-CM

## 2014-03-27 DIAGNOSIS — Z801 Family history of malignant neoplasm of trachea, bronchus and lung: Secondary | ICD-10-CM

## 2014-03-27 DIAGNOSIS — R609 Edema, unspecified: Secondary | ICD-10-CM | POA: Diagnosis present

## 2014-03-27 DIAGNOSIS — R188 Other ascites: Secondary | ICD-10-CM | POA: Diagnosis present

## 2014-03-27 DIAGNOSIS — Z860100 Personal history of colon polyps, unspecified: Secondary | ICD-10-CM

## 2014-03-27 DIAGNOSIS — R32 Unspecified urinary incontinence: Secondary | ICD-10-CM | POA: Diagnosis present

## 2014-03-27 DIAGNOSIS — J4489 Other specified chronic obstructive pulmonary disease: Secondary | ICD-10-CM | POA: Diagnosis present

## 2014-03-27 DIAGNOSIS — Z96659 Presence of unspecified artificial knee joint: Secondary | ICD-10-CM

## 2014-03-27 DIAGNOSIS — Z9849 Cataract extraction status, unspecified eye: Secondary | ICD-10-CM

## 2014-03-27 DIAGNOSIS — Z87891 Personal history of nicotine dependence: Secondary | ICD-10-CM

## 2014-03-27 DIAGNOSIS — C182 Malignant neoplasm of ascending colon: Secondary | ICD-10-CM | POA: Diagnosis present

## 2014-03-27 DIAGNOSIS — M1710 Unilateral primary osteoarthritis, unspecified knee: Secondary | ICD-10-CM

## 2014-03-27 DIAGNOSIS — Z8249 Family history of ischemic heart disease and other diseases of the circulatory system: Secondary | ICD-10-CM

## 2014-03-27 DIAGNOSIS — E871 Hypo-osmolality and hyponatremia: Secondary | ICD-10-CM | POA: Diagnosis present

## 2014-03-27 DIAGNOSIS — Z66 Do not resuscitate: Secondary | ICD-10-CM | POA: Diagnosis not present

## 2014-03-27 DIAGNOSIS — I5032 Chronic diastolic (congestive) heart failure: Secondary | ICD-10-CM | POA: Diagnosis present

## 2014-03-27 DIAGNOSIS — Z880 Allergy status to penicillin: Secondary | ICD-10-CM

## 2014-03-27 DIAGNOSIS — C787 Secondary malignant neoplasm of liver and intrahepatic bile duct: Secondary | ICD-10-CM | POA: Diagnosis present

## 2014-03-27 DIAGNOSIS — Z853 Personal history of malignant neoplasm of breast: Secondary | ICD-10-CM

## 2014-03-27 DIAGNOSIS — I509 Heart failure, unspecified: Secondary | ICD-10-CM | POA: Diagnosis present

## 2014-03-27 DIAGNOSIS — J9600 Acute respiratory failure, unspecified whether with hypoxia or hypercapnia: Secondary | ICD-10-CM | POA: Diagnosis present

## 2014-03-27 DIAGNOSIS — M810 Age-related osteoporosis without current pathological fracture: Secondary | ICD-10-CM | POA: Diagnosis present

## 2014-03-27 DIAGNOSIS — K560 Paralytic ileus: Secondary | ICD-10-CM | POA: Diagnosis present

## 2014-03-27 DIAGNOSIS — E042 Nontoxic multinodular goiter: Secondary | ICD-10-CM | POA: Diagnosis present

## 2014-03-27 DIAGNOSIS — R0682 Tachypnea, not elsewhere classified: Secondary | ICD-10-CM | POA: Diagnosis present

## 2014-03-27 DIAGNOSIS — R0600 Dyspnea, unspecified: Secondary | ICD-10-CM

## 2014-03-27 DIAGNOSIS — J449 Chronic obstructive pulmonary disease, unspecified: Secondary | ICD-10-CM | POA: Diagnosis present

## 2014-03-27 DIAGNOSIS — K562 Volvulus: Principal | ICD-10-CM | POA: Diagnosis present

## 2014-03-27 DIAGNOSIS — Z79899 Other long term (current) drug therapy: Secondary | ICD-10-CM

## 2014-03-27 DIAGNOSIS — IMO0002 Reserved for concepts with insufficient information to code with codable children: Secondary | ICD-10-CM

## 2014-03-27 DIAGNOSIS — Z515 Encounter for palliative care: Secondary | ICD-10-CM

## 2014-03-27 DIAGNOSIS — C7800 Secondary malignant neoplasm of unspecified lung: Secondary | ICD-10-CM | POA: Diagnosis present

## 2014-03-27 DIAGNOSIS — Z882 Allergy status to sulfonamides status: Secondary | ICD-10-CM

## 2014-03-27 LAB — CBC WITH DIFFERENTIAL/PLATELET
Basophils Absolute: 0 10*3/uL (ref 0.0–0.1)
Basophils Absolute: 0 10*3/uL (ref 0.0–0.1)
Basophils Relative: 0 % (ref 0–1)
Basophils Relative: 0 % (ref 0–1)
EOS ABS: 0 10*3/uL (ref 0.0–0.7)
EOS PCT: 0 % (ref 0–5)
Eosinophils Absolute: 0 10*3/uL (ref 0.0–0.7)
Eosinophils Relative: 0 % (ref 0–5)
HCT: 38.5 % (ref 36.0–46.0)
HCT: 39.8 % (ref 36.0–46.0)
Hemoglobin: 12.9 g/dL (ref 12.0–15.0)
Hemoglobin: 13.4 g/dL (ref 12.0–15.0)
LYMPHS ABS: 1 10*3/uL (ref 0.7–4.0)
LYMPHS PCT: 10 % — AB (ref 12–46)
LYMPHS PCT: 12 % (ref 12–46)
Lymphs Abs: 1.4 10*3/uL (ref 0.7–4.0)
MCH: 29.3 pg (ref 26.0–34.0)
MCH: 29.6 pg (ref 26.0–34.0)
MCHC: 33.5 g/dL (ref 30.0–36.0)
MCHC: 33.7 g/dL (ref 30.0–36.0)
MCV: 87.3 fL (ref 78.0–100.0)
MCV: 87.9 fL (ref 78.0–100.0)
MONO ABS: 0.8 10*3/uL (ref 0.1–1.0)
MONO ABS: 0.9 10*3/uL (ref 0.1–1.0)
MONOS PCT: 8 % (ref 3–12)
MONOS PCT: 8 % (ref 3–12)
Neutro Abs: 8.5 10*3/uL — ABNORMAL HIGH (ref 1.7–7.7)
Neutro Abs: 9.8 10*3/uL — ABNORMAL HIGH (ref 1.7–7.7)
Neutrophils Relative %: 82 % — ABNORMAL HIGH (ref 43–77)
Neutrophils Relative %: 81 % — ABNORMAL HIGH (ref 43–77)
PLATELETS: 191 10*3/uL (ref 150–400)
PLATELETS: 186 10*3/uL (ref 150–400)
RBC: 4.41 MIL/uL (ref 3.87–5.11)
RBC: 4.53 MIL/uL (ref 3.87–5.11)
RDW: 14.4 % (ref 11.5–15.5)
RDW: 14.5 % (ref 11.5–15.5)
WBC: 10.4 10*3/uL (ref 4.0–10.5)
WBC: 12.2 10*3/uL — ABNORMAL HIGH (ref 4.0–10.5)

## 2014-03-27 LAB — COMPREHENSIVE METABOLIC PANEL
ALBUMIN: 2.9 g/dL — AB (ref 3.5–5.2)
ALT: 17 U/L (ref 0–35)
ALT: 16 U/L (ref 0–35)
AST: 28 U/L (ref 0–37)
AST: 34 U/L (ref 0–37)
Albumin: 3.1 g/dL — ABNORMAL LOW (ref 3.5–5.2)
Alkaline Phosphatase: 124 U/L — ABNORMAL HIGH (ref 39–117)
Alkaline Phosphatase: 135 U/L — ABNORMAL HIGH (ref 39–117)
BILIRUBIN TOTAL: 0.7 mg/dL (ref 0.3–1.2)
BUN: 24 mg/dL — AB (ref 6–23)
BUN: 23 mg/dL (ref 6–23)
CHLORIDE: 93 meq/L — AB (ref 96–112)
CHLORIDE: 95 meq/L — AB (ref 96–112)
CO2: 25 meq/L (ref 19–32)
CO2: 25 meq/L (ref 19–32)
CREATININE: 0.66 mg/dL (ref 0.50–1.10)
Calcium: 9.3 mg/dL (ref 8.4–10.5)
Calcium: 9.3 mg/dL (ref 8.4–10.5)
Creatinine, Ser: 0.68 mg/dL (ref 0.50–1.10)
GFR calc Af Amer: 89 mL/min — ABNORMAL LOW (ref >90)
GFR calc Af Amer: 88 mL/min — ABNORMAL LOW (ref >90)
GFR, EST NON AFRICAN AMERICAN: 77 mL/min — AB (ref >90)
GFR, EST NON AFRICAN AMERICAN: 76 mL/min — AB (ref >90)
GLUCOSE: 89 mg/dL (ref 70–99)
Glucose, Bld: 79 mg/dL (ref 70–99)
Potassium: 4.1 mEq/L (ref 3.7–5.3)
Potassium: 4.5 mEq/L (ref 3.7–5.3)
SODIUM: 133 meq/L — AB (ref 137–147)
Sodium: 132 mEq/L — ABNORMAL LOW (ref 137–147)
Total Bilirubin: 0.8 mg/dL (ref 0.3–1.2)
Total Protein: 6.4 g/dL (ref 6.0–8.3)
Total Protein: 5.9 g/dL — ABNORMAL LOW (ref 6.0–8.3)

## 2014-03-27 LAB — URINALYSIS, ROUTINE W REFLEX MICROSCOPIC
Bilirubin Urine: NEGATIVE
Glucose, UA: NEGATIVE mg/dL
Hgb urine dipstick: NEGATIVE
Ketones, ur: NEGATIVE mg/dL
Leukocytes, UA: NEGATIVE
Nitrite: NEGATIVE
PROTEIN: NEGATIVE mg/dL
Specific Gravity, Urine: 1.015 (ref 1.005–1.030)
Urobilinogen, UA: 0.2 mg/dL (ref 0.0–1.0)
pH: 6 (ref 5.0–8.0)

## 2014-03-27 LAB — MAGNESIUM: Magnesium: 1.9 mg/dL (ref 1.5–2.5)

## 2014-03-27 LAB — TSH: TSH: 1.99 u[IU]/mL (ref 0.350–4.500)

## 2014-03-27 LAB — I-STAT TROPONIN, ED: Troponin i, poc: 0.01 ng/mL (ref 0.00–0.08)

## 2014-03-27 LAB — PHOSPHORUS: Phosphorus: 3.5 mg/dL (ref 2.3–4.6)

## 2014-03-27 LAB — PROTIME-INR
INR: 1.13 (ref 0.00–1.49)
Prothrombin Time: 14.3 seconds (ref 11.6–15.2)

## 2014-03-27 LAB — LIPASE, BLOOD: LIPASE: 480 U/L — AB (ref 11–59)

## 2014-03-27 LAB — APTT: APTT: 31 s (ref 24–37)

## 2014-03-27 LAB — PRO B NATRIURETIC PEPTIDE: Pro B Natriuretic peptide (BNP): 1262 pg/mL — ABNORMAL HIGH (ref 0–450)

## 2014-03-27 MED ORDER — HYDROMORPHONE HCL PF 1 MG/ML IJ SOLN: 0.5000 mg | INTRAMUSCULAR | Status: DC | PRN

## 2014-03-27 MED ORDER — IOHEXOL 300 MG/ML  SOLN
10.0000 mL | Freq: Once | INTRAMUSCULAR | Status: AC | PRN
  Administered 2014-03-27: 10 mL

## 2014-03-27 MED ORDER — FENTANYL CITRATE 0.05 MG/ML IJ SOLN
INTRAMUSCULAR | Status: AC | PRN
  Administered 2014-03-27: 50 ug via INTRAVENOUS

## 2014-03-27 MED ORDER — MIDAZOLAM HCL 2 MG/2ML IJ SOLN
INTRAMUSCULAR | Status: AC | PRN
  Administered 2014-03-27: 1 mg via INTRAVENOUS

## 2014-03-27 MED ORDER — MORPHINE SULFATE 2 MG/ML IJ SOLN
1.0000 mg | INTRAMUSCULAR | Status: DC | PRN
  Administered 2014-03-27 (×2): 4 mg via INTRAVENOUS
  Administered 2014-03-28 (×2): 2 mg via INTRAVENOUS
  Administered 2014-03-28: 4 mg via INTRAVENOUS
  Administered 2014-03-28 – 2014-03-29 (×2): 2 mg via INTRAVENOUS
  Administered 2014-03-29: 4 mg via INTRAVENOUS
  Filled 2014-03-27: qty 2
  Filled 2014-03-27 (×4): qty 1
  Filled 2014-03-27 (×2): qty 2

## 2014-03-27 MED ORDER — ONDANSETRON HCL 4 MG PO TABS: 4.0000 mg | ORAL_TABLET | Freq: Four times a day (QID) | ORAL | Status: DC | PRN

## 2014-03-27 MED ORDER — SODIUM CHLORIDE 0.9 % IV SOLN
INTRAVENOUS | Status: AC
  Administered 2014-03-27: 17:00:00 via INTRAVENOUS

## 2014-03-27 MED ORDER — CIPROFLOXACIN IN D5W 400 MG/200ML IV SOLN
INTRAVENOUS | Status: AC
  Administered 2014-03-27: 400 mg
  Filled 2014-03-27: qty 200

## 2014-03-27 MED ORDER — CIPROFLOXACIN IN D5W 400 MG/200ML IV SOLN: 400.0000 mg | Freq: Once | INTRAVENOUS | Status: DC

## 2014-03-27 MED ORDER — FENTANYL CITRATE 0.05 MG/ML IJ SOLN
INTRAMUSCULAR | Status: AC
  Filled 2014-03-27: qty 4

## 2014-03-27 MED ORDER — FENTANYL CITRATE 0.05 MG/ML IJ SOLN
50.0000 ug | Freq: Once | INTRAMUSCULAR | Status: AC
  Administered 2014-03-27: 50 ug via INTRAVENOUS
  Filled 2014-03-27: qty 2

## 2014-03-27 MED ORDER — MIDAZOLAM HCL 2 MG/2ML IJ SOLN
INTRAMUSCULAR | Status: AC
  Filled 2014-03-27: qty 4

## 2014-03-27 MED ORDER — MORPHINE SULFATE 2 MG/ML IJ SOLN
INTRAMUSCULAR | Status: AC
  Administered 2014-03-27: 4 mg via INTRAVENOUS
  Filled 2014-03-27: qty 2

## 2014-03-27 MED ORDER — ONDANSETRON HCL 4 MG/2ML IJ SOLN
4.0000 mg | Freq: Once | INTRAMUSCULAR | Status: AC
  Administered 2014-03-27: 4 mg via INTRAVENOUS
  Filled 2014-03-27: qty 2

## 2014-03-27 MED ORDER — MORPHINE SULFATE 4 MG/ML IJ SOLN
4.0000 mg | Freq: Once | INTRAMUSCULAR | Status: AC
  Administered 2014-03-27: 4 mg via INTRAVENOUS
  Filled 2014-03-27: qty 1

## 2014-03-27 MED ORDER — ACETAMINOPHEN 650 MG RE SUPP: 650.0000 mg | Freq: Four times a day (QID) | RECTAL | Status: DC | PRN

## 2014-03-27 MED ORDER — ONDANSETRON HCL 4 MG/2ML IJ SOLN: 4.0000 mg | Freq: Four times a day (QID) | INTRAMUSCULAR | Status: DC | PRN

## 2014-03-27 MED ORDER — LIDOCAINE HCL 1 % IJ SOLN
INTRAMUSCULAR | Status: AC
  Filled 2014-03-27: qty 20

## 2014-03-27 MED ORDER — ACETAMINOPHEN 325 MG PO TABS: 650.0000 mg | ORAL_TABLET | Freq: Four times a day (QID) | ORAL | Status: DC | PRN

## 2014-03-27 NOTE — Consult Note (Signed)
Reason for Consult:abdominal pain PCP:  [name], MD  Referring Physician: Dr. Leisa Lenz  Ana Fisher is an 80 y.o. female.  HPI: 80 y/o with abdominal pain for about a week. Office notes on 03/18/14 show she complained of Left hip, pain below right breast, Spinal pain, weight loss, ongoing but worse bilat lower leg pain, increased DOE, nocturia, and decreased appetite.  Family report pain was attributed to musculoskeletal pain.  Since then she reports 2 days of worsening pain,abdominal distension, and loose soft stools for 2 days.  She finally told one of her daughters this AM.  The daughter came over and got her OOB to the bathroom where she had a syncopal episode.  She now presents to the ED with 10/10 abdominal pain, severe distension, acute 3 way film shows a 12.4 cm cecum with possible volvulus.  She is afebrile, no BM since early this AM.  Na 132, alk phos 124, BNP 1262,  WBC 1262, with left shift.  UA is negative.  We are ask to see.  Past Medical History  Diagnosis Date  Breast cancer with bilateral mastectomies and reconstructions.   COPD, tobacco use for 34 years   Chronic lower extremity edema/ 2D echo 12/22/13  EF 55-60%;  Stage 1 systolic dysfunction   Chronic constipation and diarrhea   Goiter, nontoxic, multinodular   Urinary incontinence   Ankle edema 2009  Constipation   Hx of colonic polyps   Osteoporosis   Breast cancer   Escherichia coli urinary tract infection 07/27/13   Cipro prescribed by [facility] urgent care    Past Surgical History  Procedure Laterality Date  Appendectomy    Colonoscopy w/ polypectomy    Abdominal hysterectomy    Bilateral salpingoophorectomy    Mastectomy     bilateral  Tonsillectomy    Total knee arthroplasty    Last Colonoscopy 10 years ago, told she did not need further surveillance     Cataract extraction, bilateral      Family History  Problem Relation Age of Onset  . Cancer Father     lung cancer  . Aortic aneurysm  Brother   . Hypertension Mother   . Cancer Daughter     Social History:  reports that she quit smoking about 31 years ago. She has never used smokeless tobacco. She reports that she drinks alcohol. She reports that she does not use illicit drugs.  Allergies:  Allergies  Allergen Reactions  . Penicillins     Hives Because of a history of documented adverse serious drug reaction;Medi Alert bracelet  is recommended  . Sulfonamide Derivatives     She denies allergy to Sulfa    Medications:  Prior to Admission:  Prescriptions prior to admission  Medication Sig Dispense Refill  . Calcium Carbonate (CALTRATE 600 PO) Take by mouth 2 (two) times daily.        . furosemide (LASIX) 20 MG tablet Take 60 mg by mouth daily.       . mirabegron ER (MYRBETRIQ) 50 MG TB24 tablet Take 50 mg by mouth daily.      . Multiple Vitamins-Minerals (CENTRUM SILVER PO) Take 1 tablet by mouth every Monday, Wednesday, and Friday.       . naproxen sodium (ANAPROX) 220 MG tablet Take 220 mg by mouth 2 (two) times daily with a meal.       . Omega-3 Fatty Acids (FISH OIL) 1200 MG CAPS Take by mouth 2 (two) times daily.        Marland Kitchen  traMADol (ULTRAM) 50 MG tablet Take 50 mg by mouth every 6 (six) hours as needed for moderate pain.       Scheduled: . sodium chloride   Intravenous STAT   Continuous:  DQQ:[id number], acetaminophen, morphine injection, ondansetron (ZOFRAN) IV, ondansetron Anti-infectives   None      Results for orders placed during the hospital encounter of 04/26/2014 (from the past 48 hour(s))  CBC WITH DIFFERENTIAL     Status: Abnormal   Collection Time    04/06/2014 11:05 AM      Result Value Ref Range   WBC 10.4  4.0 - 10.5 K/uL   RBC 4.41  3.87 - 5.11 MIL/uL   Hemoglobin 12.9  12.0 - 15.0 g/dL   HCT 38.5  36.0 - 46.0 %   MCV 87.3  78.0 - 100.0 fL   MCH 29.3  26.0 - 34.0 pg   MCHC 33.5  30.0 - 36.0 g/dL   RDW 14.4  11.5 - 15.5 %   Platelets 191  150 - 400 K/uL   Neutrophils Relative % 82  (*) 43 - 77 %   Neutro Abs 8.5 (*) 1.7 - 7.7 K/uL   Lymphocytes Relative 10 (*) 12 - 46 %   Lymphs Abs 1.0  0.7 - 4.0 K/uL   Monocytes Relative 8  3 - 12 %   Monocytes Absolute 0.8  0.1 - 1.0 K/uL   Eosinophils Relative 0  0 - 5 %   Eosinophils Absolute 0.0  0.0 - 0.7 K/uL   Basophils Relative 0  0 - 1 %   Basophils Absolute 0.0  0.0 - 0.1 K/uL  COMPREHENSIVE METABOLIC PANEL     Status: Abnormal   Collection Time    04/20/2014 11:05 AM      Result Value Ref Range   Sodium 132 (*) 137 - 147 mEq/L   Potassium 4.1  3.7 - 5.3 mEq/L   Chloride 93 (*) 96 - 112 mEq/L   CO2 25  19 - 32 mEq/L   Glucose, Bld 89  70 - 99 mg/dL   BUN 24 (*) 6 - 23 mg/dL   Creatinine, Ser 0.66  0.50 - 1.10 mg/dL   Calcium 9.3  8.4 - 10.5 mg/dL   Total Protein 6.4  6.0 - 8.3 g/dL   Albumin 3.1 (*) 3.5 - 5.2 g/dL   AST 28  0 - 37 U/L   ALT 17  0 - 35 U/L   Alkaline Phosphatase 124 (*) 39 - 117 U/L   Total Bilirubin 0.7  0.3 - 1.2 mg/dL   GFR calc non Af Amer 77 (*) >90 mL/min   GFR calc Af Amer 89 (*) >90 mL/min   Comment: (NOTE)     The eGFR has been calculated using the CKD EPI equation.     This calculation has not been validated in all clinical situations.     eGFR's persistently <90 mL/min signify possible Chronic Kidney     Disease.  LIPASE, BLOOD     Status: Abnormal   Collection Time    04/22/2014 11:05 AM      Result Value Ref Range   Lipase 480 (*) 11 - 59 U/L  PRO B NATRIURETIC PEPTIDE     Status: Abnormal   Collection Time    04/18/2014 11:05 AM      Result Value Ref Range   Pro B Natriuretic peptide (BNP) 1262.0 (*) 0 - 450 pg/mL  Randolm Idol, ED     Status:  None   Collection Time    04/01/2014 11:15 AM      Result Value Ref Range   Troponin i, poc 0.01  0.00 - 0.08 ng/mL   Comment 3            Comment: Due to the release kinetics of cTnI,     a negative result within the first hours     of the onset of symptoms does not rule out     myocardial infarction with certainty.     If  myocardial infarction is still suspected,     repeat the test at appropriate intervals.  URINALYSIS, ROUTINE W REFLEX MICROSCOPIC     Status: None   Collection Time    03/31/2014 12:49 PM      Result Value Ref Range   Color, Urine YELLOW  YELLOW   APPearance CLEAR  CLEAR   Specific Gravity, Urine 1.015  1.005 - 1.030   pH 6.0  5.0 - 8.0   Glucose, UA NEGATIVE  NEGATIVE mg/dL   Hgb urine dipstick NEGATIVE  NEGATIVE   Bilirubin Urine NEGATIVE  NEGATIVE   Ketones, ur NEGATIVE  NEGATIVE mg/dL   Protein, ur NEGATIVE  NEGATIVE mg/dL   Urobilinogen, UA 0.2  0.0 - 1.0 mg/dL   Nitrite NEGATIVE  NEGATIVE   Leukocytes, UA NEGATIVE  NEGATIVE   Comment: MICROSCOPIC NOT DONE ON URINES WITH NEGATIVE PROTEIN, BLOOD, LEUKOCYTES, NITRITE, OR GLUCOSE <1000 mg/dL.  MAGNESIUM     Status: None   Collection Time    04/03/2014  2:11 PM      Result Value Ref Range   Magnesium 1.9  1.5 - 2.5 mg/dL    Dg Abd Acute W/chest  03/28/2014   CLINICAL DATA:  Lower abdominal pain, abdominal distention, history chronic constipation, breast cancer, smoking  EXAM: ACUTE ABDOMEN SERIES (ABDOMEN 2 VIEW & CHEST 1 VIEW)  COMPARISON:  Chest radiographs 03/18/2014, no prior abdominal radiographs for comparison  FINDINGS: Normal heart size, mediastinal contours, and pulmonary vascularity.  Density in RIGHT mid lung is unchanged question related to breast prosthesis, unchanged.  Underlying emphysematous changes.  Atherosclerotic calcification aorta.  No definite infiltrate, pleural effusion or pneumothorax.  Diffuse osseous demineralization.  Gaseous distention cecum, 12.4 cm diameter.  Minimal stool in proximal descending colon and rectum with out additional colonic gas.  Air-filled loops of upper normal caliber small bowel throughout abdomen.  No definite evidence of bowel obstruction, bowel wall thickening or free air.  No urinary tract calcification.  IMPRESSION: COPD changes without acute infiltrate.  Marked gaseous distention of the  cecum 12.4 cm diameter with paucity of distal colonic gas highly suspicious for cecal volvulus.  Findings called to Dr. Alvino Chapel on 04/12/2014 at 1211 hr.   Electronically Signed   By: Lavonia Dana M.D.   On: 04/25/2014 12:12    Review of Systems  Constitutional: Positive for weight loss. Negative for fever and chills.  HENT: Negative.   Eyes: Negative.   Respiratory: Positive for shortness of breath (DOE).   Cardiovascular: Positive for orthopnea and leg swelling (chronic edema).  Gastrointestinal: Positive for heartburn (OCCASIONAL), abdominal pain (PAIN STARTED AT LEAST A WEEK AGO, SHE SAW pcp AND REPORTED AS musculoskeletal), diarrhea and constipation. Negative for nausea and vomiting.       She has chronic issues with diarrhea and constipation, her daughter say all of her 41 years  Genitourinary: Positive for urgency and frequency.  Musculoskeletal:       She has significant balance  issues and uses a walker.  Skin: Negative.   Neurological: Positive for dizziness and loss of consciousness.       Syncope this Am in bathroom  Psychiatric/Behavioral: Negative.    Blood pressure 141/66, pulse 88, temperature 97.7 F (36.5 C), temperature source Oral, resp. rate 22, SpO2 94.00%. Physical Exam  Constitutional: She is oriented to person, place, and time.  Frail elderly cachectic WF, pain is better now but acutely distended abdomen.  Cardiovascular: Normal rate, regular rhythm, normal heart sounds and intact distal pulses.  Exam reveals no gallop.   Respiratory: Effort normal and breath sounds normal. No respiratory distress. She has no wheezes. She has no rales. She exhibits no tenderness.  GI: There is tenderness. There is rebound and guarding.  She has a hugely distended abdomen, with tenderness all over. Midline abdominal scar.  Musculoskeletal: She exhibits tenderness. Edema: 3-4 edema both lower legs.  Neurological: She is alert and oriented to person, place, and time. No cranial  nerve deficit.  Skin: Skin is warm and dry. No rash noted. No erythema. No pallor.  Psychiatric: She has a normal mood and affect. Her behavior is normal.  She has had morphine and fentanyl.    Assessment/Plan: 1.  Acute cecal dilaitation with possible volvulus. 2.  COPD, tobacco use for 34 years  3.  Chronic lower extremity edema/ 2D echo 12/22/13 EF 55-60%; Stage 1 systolic dysfunction  4.  Breast cancer with bilateral mastectomies and reconstructions  5.  Goiter, nontoxic, multinodular  6.  Urinary incontinence  7.  Hx of colonic polyps  8.  Osteoporosis  9.  Breast cancer with bilateal mastectomies and reconstructions 10.  Escherichia coli urinary tract infection    Plan:  She has been admitted to Medicine, we are getting a Ct scan and will decide on whether to take her to the OR or do an IR cecostomy tube tonight.  Dr. Harlow Asa will see her and follow up.     Earnstine Regal 04/17/2014, 4:02 PM

## 2014-03-27 NOTE — Telephone Encounter (Signed)
Called daughter back...she stated that her mother was being seen in the ER. Informed her that the ER MD will handle her mothers care and that if she needed anything from [place] to give [place] a call.

## 2014-03-27 NOTE — Consult Note (Signed)
General Surgery - [facility]  Reviewed CT scan with Drs. Gallerani, Yamagata, and Watts from radiology and Dr. Rosenbower from general surgery.  Patient appears to have extensive metastatic disease involving the liver and possibly lung.  Large volume of ascites.  Cecal distension may represent malignant obstruction.  May be recurrent breast cancer versus colon cancer.  Discussed findings with patient and two daughters and her nurse at the bedside.  Patient very strong and accepting of diagnosis of metastatic cancer.  I would like to proceed with percutaneous decompression of the cecum with a cecostomy tube to be placed by interventional radiology.  Dr. Watts agrees to proceed and will make arrangements for the procedure tonight.  Medical service notified, Dr. Devine.  Corrie Reder M. Darrall Strey, MD, FACS [facility] Office: [phone]   

## 2014-03-27 NOTE — Telephone Encounter (Signed)
Syncope diagnosis should expedite evaluation

## 2014-03-27 NOTE — ED Provider Notes (Signed)
CSN: [id number]     Arrival date & time 04/22/2014  1019 History   First MD Initiated Contact with Patient 04/17/2014 1024     Chief Complaint  Patient presents with  . Abdominal Pain     (Consider location/radiation/quality/duration/timing/severity/associated sxs/prior Treatment) Patient is a 80 y.o. female presenting with abdominal pain. The history is provided by the patient.  Abdominal Pain Associated symptoms: nausea   Associated symptoms: no chest pain, no diarrhea, no shortness of breath and no vomiting    patient presents with increasing abdominal pain. Her abdomen has become more distended over last 2 days. She's had nausea vomiting. She states that she had had some constipation and then was on vacation they gave her 4 bowel movements. She states she decided to switch back to her other medicine and hasn't had a bowel movement since. No fevers. She has moderate right-sided abdominal pain. She states she passed out earlier today, likely to the pain. No headache. No Confusion. No injury from the syncope. Past Medical History  Diagnosis Date  . Goiter, nontoxic, multinodular   . Urinary incontinence   . Ankle edema 2009  . Constipation   . Hx of colonic polyps   . Osteoporosis   . Breast cancer   . Escherichia coli urinary tract infection 07/27/13    Cipro prescribed by [facility] urgent care   Past Surgical History  Procedure Laterality Date  . Appendectomy    . Colonoscopy w/ polypectomy    . Abdominal hysterectomy    . Bilateral salpingoophorectomy    . Mastectomy      bilateral  . Tonsillectomy    . Total knee arthroplasty    . Cataract extraction, bilateral     Family History  Problem Relation Age of Onset  . Cancer Father     lung cancer  . Aortic aneurysm Brother   . Hypertension Mother   . Cancer Daughter    History  Substance Use Topics  . Smoking status: Former Smoker    Quit date: 11/27/1982  . Smokeless tobacco: Never Used  . Alcohol Use: Yes     Comment:  wine occasionally   OB History   Grav Para Term Preterm Abortions TAB SAB Ect Mult Living                 Review of Systems  Constitutional: Negative for activity change and appetite change.  Eyes: Negative for pain.  Respiratory: Negative for chest tightness and shortness of breath.   Cardiovascular: Positive for leg swelling. Negative for chest pain.  Gastrointestinal: Positive for nausea, abdominal pain and abdominal distention. Negative for vomiting and diarrhea.  Genitourinary: Negative for flank pain.  Musculoskeletal: Negative for back pain and neck stiffness.  Skin: Negative for rash.  Neurological: Positive for syncope. Negative for weakness, numbness and headaches.  Psychiatric/Behavioral: Negative for behavioral problems.      Allergies  Penicillins and Sulfonamide derivatives  Home Medications   Prior to Admission medications   Medication Sig Start Date End Date Taking? Authorizing Provider  Calcium Carbonate (CALTRATE 600 PO) Take by mouth 2 (two) times daily.     Yes Historical Provider, MD  furosemide (LASIX) 20 MG tablet Take 60 mg by mouth daily.  12/03/13  Yes Hendricks Limes, MD  mirabegron ER (MYRBETRIQ) 50 MG TB24 tablet Take 50 mg by mouth daily.   Yes Historical Provider, MD  Multiple Vitamins-Minerals (CENTRUM SILVER PO) Take 1 tablet by mouth every Monday, Wednesday, and Friday.  Yes Historical Provider, MD  naproxen sodium (ANAPROX) 220 MG tablet Take 220 mg by mouth 2 (two) times daily with a meal.    Yes Historical Provider, MD  Omega-3 Fatty Acids (FISH OIL) 1200 MG CAPS Take by mouth 2 (two) times daily.     Yes Historical Provider, MD  traMADol (ULTRAM) 50 MG tablet Take 50 mg by mouth every 6 (six) hours as needed for moderate pain.   Yes Historical Provider, MD   BP 141/66  Pulse 88  Temp(Src) 97.7 F (36.5 C) (Oral)  Resp 22  SpO2 94% Physical Exam  Nursing note and vitals reviewed. Constitutional: She is oriented to person, place, and  time. She appears well-developed and well-nourished.  HENT:  Head: Normocephalic and atraumatic.  Eyes: EOM are normal. Pupils are equal, round, and reactive to light.  Neck: Normal range of motion. Neck supple.  Cardiovascular: Normal rate, regular rhythm and normal heart sounds.   No murmur heard. Pulmonary/Chest: Effort normal and breath sounds normal. No respiratory distress. She has no wheezes. She has no rales.  Abdominal: She exhibits distension. There is tenderness.  Very distended abdomen. Moderate tenderness in right abdomen.  no hernias palpated.  Musculoskeletal: Normal range of motion. She exhibits edema.  Moderate bilateral lower extremity pitting edema to above the knees.  Neurological: She is alert and oriented to person, place, and time. No cranial nerve deficit.  Skin: Skin is warm and dry.  Psychiatric: She has a normal mood and affect. Her speech is normal.    ED Course  Procedures (including critical care time) Labs Review Labs Reviewed  CBC WITH DIFFERENTIAL - Abnormal; Notable for the following:    Neutrophils Relative % 82 (*)    Neutro Abs 8.5 (*)    Lymphocytes Relative 10 (*)    All other components within normal limits  COMPREHENSIVE METABOLIC PANEL - Abnormal; Notable for the following:    Sodium 132 (*)    Chloride 93 (*)    BUN 24 (*)    Albumin 3.1 (*)    Alkaline Phosphatase 124 (*)    GFR calc non Af Amer 77 (*)    GFR calc Af Amer 89 (*)    All other components within normal limits  LIPASE, BLOOD - Abnormal; Notable for the following:    Lipase 480 (*)    All other components within normal limits  PRO B NATRIURETIC PEPTIDE - Abnormal; Notable for the following:    Pro B Natriuretic peptide (BNP) 1262.0 (*)    All other components within normal limits  URINALYSIS, ROUTINE W REFLEX MICROSCOPIC  MAGNESIUM  TSH  I-STAT TROPOININ, ED    Imaging Review Dg Abd Acute W/chest  04/23/2014   CLINICAL DATA:  Lower abdominal pain, abdominal  distention, history chronic constipation, breast cancer, smoking  EXAM: ACUTE ABDOMEN SERIES (ABDOMEN 2 VIEW & CHEST 1 VIEW)  COMPARISON:  Chest radiographs 03/18/2014, no prior abdominal radiographs for comparison  FINDINGS: Normal heart size, mediastinal contours, and pulmonary vascularity.  Density in RIGHT mid lung is unchanged question related to breast prosthesis, unchanged.  Underlying emphysematous changes.  Atherosclerotic calcification aorta.  No definite infiltrate, pleural effusion or pneumothorax.  Diffuse osseous demineralization.  Gaseous distention cecum, 12.4 cm diameter.  Minimal stool in proximal descending colon and rectum with out additional colonic gas.  Air-filled loops of upper normal caliber small bowel throughout abdomen.  No definite evidence of bowel obstruction, bowel wall thickening or free air.  No urinary tract calcification.  IMPRESSION: COPD changes without acute infiltrate.  Marked gaseous distention of the cecum 12.4 cm diameter with paucity of distal colonic gas highly suspicious for cecal volvulus.  Findings called to Dr. Alvino Chapel on 03/29/14 at 1211 hr.   Electronically Signed   By: Lavonia Dana M.D.   On: Mar 29, 2014 12:12     EKG Interpretation   Date/Time:  Mar 29, 2014 10:36:07 EDT Ventricular Rate:  84 PR Interval:  193 QRS Duration: 93 QT Interval:  375 QTC Calculation: 443 R Axis:   -37 Text Interpretation:  Sinus rhythm Multiple ventricular premature  complexes Probable left atrial enlargement Low voltage, extremity and  precordial leads Consider anterior infarct Baseline wander in lead(s) II  aVR V3 Confirmed by Alvino Chapel  MD, Efe Fazzino [phone]) on 03-29-14 10:46:38 AM      MDM   Final diagnoses:  Cecal volvulus    Patient was abdominal pain. Likely cecal volvulus on x-ray. Patient be admitted to internal medicine. Discussed with gastroenterology, who reviewed the chart and stated that he needs to see Gen. surgery since it is  cecal. Discussed with general surgery, who will see the patient consult.   Jasper Riling. Alvino Chapel, [place] 03-29-2014 [phone]

## 2014-03-27 NOTE — Progress Notes (Signed)
  CARE MANAGEMENT ED NOTE 04-25-14  Patient:  Ana Fisher, Ana Fisher   Account Number:  [id number]  Date Initiated:  Apr 25, 2014  Documentation initiated by:  Jackelyn Poling  Subjective/Objective Assessment:   80 yr old [place] medicare hmo/ppo  c/o syncope, RUQ/RLQ abdominal pain starting Wednesday and obvious distention starting yesterday, last BM yesterday was loose and constipation since.     Subjective/Objective Assessment Detail:   dx volvulus  cm consult: FYI-Patient has caregivers Monday-Friday. Family can't remember name  Pt has Personal care services via [place]     Action/Plan:   Cm noted CM consult Cm reviewed EPIC notes Found pt [facility] by [facility]) in 2014 Cm spoke with staff at [facility] who confirms pt not active [facility] pt   Action/Plan Detail:   42 Cm left voice message for wynn, fussell at [phone] to inquire of name of providers in case agency needs to be contacted to be followed at [facility] Pending return call from [place] spoke with Dtrs x 2 Ana Fisher, erin) & pt   Anticipated DC Date:  03/30/2014     Status Recommendation to Physician:   Result of Recommendation:    Other ED Services  Consult Working [place]  Other  Outpatient Services - Pt will follow up   [facility] Choice  NA   Choice offered to / List presented to:  C-1 Patient     [place] arranged  HH-8 PCS/PERSONAL Malyah Ohlrich      [place] agency  OTHER - SEE NOTE    Status of service:  Completed, signed off  ED Comments:   ED Comments Detail:  1444 Cm spoke with [place] at [phone] who states she is aware of pt admission and welcomes Fisher call when pt d/c to restart services

## 2014-03-27 NOTE — Telephone Encounter (Signed)
Pt daughter called stating that pt passed out in the bathroom this am and this its due to bowl obstruction. She is on her way to [facility] via EMS and wanted to know from MD if there is anything that can be done to expedite getting her seen in the ER.

## 2014-03-27 NOTE — ED Notes (Signed)
Bed: WA04 Expected date:  Expected time:  Means of arrival:  Comments: EMS-abdominal pain/syncope

## 2014-03-27 NOTE — Procedures (Signed)
Successful fluoroscopic guided replacement of 18 Fr balloon retention cecostomy tube for decompression.   No immediate post procedural complications.   The cecostomy tube is to be connected to low wall suction.

## 2014-03-27 NOTE — H&P (Addendum)
[facility] History and Physical  Ana Fisher JYN:[id number] DOB: Aug 11, 1923 DOA: 04/22/2014  Referring physician: ER physician PCP: [name], MD   Chief Complaint: abdominal pain  HPI:  80 year old female with past medical history of breast cancer, ostearthritis, chronic diastolic CHF (2 D ECHO in 11/2013 showed EF 55%) who presented to [facility] ED 04/25/2014 with worsening abdominal pain and distention for past few days prior to this admission with associated nausea and vomiting. Abdominal pain is in mid abdomen and right side of the abdomen, 8/10 in intensity and has been relieved with analgesia given in ED. Np reports of blood in stool or urine. No fever or chills. No chest pain, shortness of breath or palpitations.  In ED, vitals are stable with BP of 141/72, HR 77, T max 98.6 F and oxygen saturation of 91 - 99% on 2 L [place] oxygen support. Abdominal X ray showed possible cecal volvulus. CT abdomen is pending at this time. Surgery has been consulted by ED physician.   Assessment and Plan:  Active Problems:   Cecal volvulus - appreciate surgery consult and recommendations - for now keep NPO in case surgery is planned - may continue IV fluids, antiemetics as needed, analgesia as needed   Hyponatremia - mild, likely dehydration or lasix - continue IV fluids    Chronic CHF, diastolic - BNP elevated at 1262 but pt dehydrated clinically so hold off on lasix and continue IV fluids for next 12 hours and reassess  - 2 D ECHO in 11/2013 showed normal EF   Radiological Exams on Admission: Dg Abd Acute W/chest  04/08/2014  IMPRESSION: COPD changes without acute infiltrate.  Marked gaseous distention of the cecum 12.4 cm diameter with paucity of distal colonic gas highly suspicious for cecal volvulus.     Code Status: Full Family Communication: Pt at bedside Disposition Plan: Admit for further evaluation  Robbie Lis, MD  [facility] Pager [phone]  Review of Systems:   Constitutional: Negative for fever, chills and malaise/fatigue. Negative for diaphoresis.  HENT: Negative for hearing loss, ear pain, nosebleeds, congestion, sore throat, neck pain, tinnitus and ear discharge.   Eyes: Negative for blurred vision, double vision, photophobia, pain, discharge and redness.  Respiratory: Negative for cough, hemoptysis, sputum production, shortness of breath, wheezing and stridor.   Cardiovascular: Negative for chest pain, palpitations, orthopnea, claudication and leg swelling.  Gastrointestinal: per HPI.  Genitourinary: Negative for dysuria, urgency, frequency, hematuria and flank pain.  Musculoskeletal: Negative for myalgias, back pain, joint pain and falls.  Skin: Negative for itching and rash.  Neurological: Negative for dizziness and weakness. Negative for tingling, tremors, sensory change, speech change, focal weakness, loss of consciousness and headaches.  Endo/Heme/Allergies: Negative for environmental allergies and polydipsia. Does not bruise/bleed easily.  Psychiatric/Behavioral: Negative for suicidal ideas. The patient is not nervous/anxious.      Past Medical History  Diagnosis Date  . Goiter, nontoxic, multinodular   . Urinary incontinence   . Ankle edema 2009  . Constipation   . Hx of colonic polyps   . Osteoporosis   . Breast cancer   . Escherichia coli urinary tract infection 07/27/13    Cipro prescribed by [facility] urgent care   Past Surgical History  Procedure Laterality Date  . Appendectomy    . Colonoscopy w/ polypectomy    . Abdominal hysterectomy    . Bilateral salpingoophorectomy    . Mastectomy      bilateral  . Tonsillectomy    .  Total knee arthroplasty    . Cataract extraction, bilateral     Social History:  reports that she quit smoking about 31 years ago. She has never used smokeless tobacco. She reports that she drinks alcohol. She reports that she does not use illicit drugs.  Allergies  Allergen Reactions  .  Penicillins     Hives Because of a history of documented adverse serious drug reaction;Medi Alert bracelet  is recommended  . Sulfonamide Derivatives     She denies allergy to Sulfa    Family History  Problem Relation Age of Onset  . Cancer Father     lung cancer  . Aortic aneurysm Brother   . Hypertension Mother   . Cancer Daughter      Prior to Admission medications   Medication Sig Start Date End Date Taking? Authorizing Provider  Calcium Carbonate (CALTRATE 600 PO) Take by mouth 2 (two) times daily.     Yes Historical Provider, MD  furosemide (LASIX) 20 MG tablet Take 60 mg by mouth daily.  12/03/13  Yes Hendricks Limes, MD  mirabegron ER (MYRBETRIQ) 50 MG TB24 tablet Take 50 mg by mouth daily.   Yes Historical Provider, MD  Multiple Vitamins-Minerals (CENTRUM SILVER PO) Take 1 tablet by mouth every Monday, Wednesday, and Friday.    Yes Historical Provider, MD  naproxen sodium (ANAPROX) 220 MG tablet Take 220 mg by mouth 2 (two) times daily with a meal.    Yes Historical Provider, MD  Omega-3 Fatty Acids (FISH OIL) 1200 MG CAPS Take by mouth 2 (two) times daily.     Yes Historical Provider, MD  traMADol (ULTRAM) 50 MG tablet Take 50 mg by mouth every 6 (six) hours as needed for moderate pain.   Yes Historical Provider, MD   Physical Exam: Filed Vitals:   03/31/2014 1029 04/16/2014 1034 04/19/2014 1041  BP:  141/72   Pulse: 85    Temp:   97.7 F (36.5 C)  TempSrc:   Oral  Resp: 18 19   SpO2: 97%      Physical Exam  Constitutional: Appears well-developed and well-nourished. No distress.  HENT: Normocephalic. Dry mucus membranes   Eyes: Conjunctivae and EOM are normal. PERRLA, no scleral icterus.  Neck: Normal ROM. Neck supple. No JVD. No tracheal deviation.  CVS: RRR, S1/S2 +, no murmurs, no gallops, no carotid bruit.  Pulmonary: Effort and breath sounds normal, no stridor, rhonchi, wheezes, rales.  Abdominal: distended and tender across mid abdomen, no guarding   Musculoskeletal: Normal range of motion. LE edema but no tenderness.  Lymphadenopathy: No lymphadenopathy noted, cervical, inguinal. Neuro: Alert. No focal neurologic deficits  Skin: Skin is warm and dry. Psychiatric: Normal mood and affect.   Labs on Admission:  Basic Metabolic Panel:  Recent Labs Lab 04/21/2014 1105  NA 132*  K 4.1  CL 93*  CO2 25  GLUCOSE 89  BUN 24*  CREATININE 0.66  CALCIUM 9.3   Liver Function Tests:  Recent Labs Lab 04/12/2014 1105  AST 28  ALT 17  ALKPHOS 124*  BILITOT 0.7  PROT 6.4  ALBUMIN 3.1*    Recent Labs Lab 04/23/2014 1105  LIPASE 480*   No results found for this basename: AMMONIA,  in the last 168 hours CBC:  Recent Labs Lab 04/08/2014 1105  WBC 10.4  NEUTROABS 8.5*  HGB 12.9  HCT 38.5  MCV 87.3  PLT 191   Cardiac Enzymes: No results found for this basename: CKTOTAL, CKMB, CKMBINDEX, TROPONINI,  in  the last 168 hours BNP: No components found with this basename: POCBNP,  CBG: No results found for this basename: GLUCAP,  in the last 168 hours  If 7PM-7AM, please contact night-coverage [email or web address] Password [email or web address] 03/29/2014, 1:25 PM

## 2014-03-27 NOTE — ED Notes (Signed)
Per EMS patient c/o RUQ/RLQ abdominal pain starting Wednesday and obvious distention starting yesterday, last BM yesterday was loose and constipation since.

## 2014-03-27 NOTE — Progress Notes (Signed)
General Surgery [facility]  Reviewed CT scan with Drs. Marcelyn Bruins, and Watts from radiology and Dr. Zella Richer from general surgery.  Patient appears to have extensive metastatic disease involving the liver and possibly lung.  Large volume of ascites.  Cecal distension may represent malignant obstruction.  May be recurrent breast cancer versus colon cancer.  Discussed findings with patient and two daughters and her nurse at the bedside.  Patient very strong and accepting of diagnosis of metastatic cancer.  I would like to proceed with percutaneous decompression of the cecum with a cecostomy tube to be placed by interventional radiology.  Dr. Pascal Lux agrees to proceed and will make arrangements for the procedure tonight.  Medical service notified, Dr. Charlies Silvers.  Earnstine Regal, MD, [facility] Office: [phone]

## 2014-03-27 NOTE — Telephone Encounter (Signed)
Ana Fisher would like a phone call at [phone].

## 2014-03-28 DIAGNOSIS — K56609 Unspecified intestinal obstruction, unspecified as to partial versus complete obstruction: Secondary | ICD-10-CM

## 2014-03-28 LAB — COMPREHENSIVE METABOLIC PANEL
ALK PHOS: 128 U/L — AB (ref 39–117)
ALT: 15 U/L (ref 0–35)
AST: 31 U/L (ref 0–37)
Albumin: 2.3 g/dL — ABNORMAL LOW (ref 3.5–5.2)
BUN: 21 mg/dL (ref 6–23)
CHLORIDE: 101 meq/L (ref 96–112)
CO2: 25 meq/L (ref 19–32)
Calcium: 8.5 mg/dL (ref 8.4–10.5)
Creatinine, Ser: 0.67 mg/dL (ref 0.50–1.10)
GFR calc Af Amer: 88 mL/min — ABNORMAL LOW (ref >90)
GFR calc non Af Amer: 76 mL/min — ABNORMAL LOW (ref >90)
Glucose, Bld: 61 mg/dL — ABNORMAL LOW (ref 70–99)
POTASSIUM: 4.4 meq/L (ref 3.7–5.3)
SODIUM: 137 meq/L (ref 137–147)
TOTAL PROTEIN: 5.2 g/dL — AB (ref 6.0–8.3)
Total Bilirubin: 0.7 mg/dL (ref 0.3–1.2)

## 2014-03-28 LAB — CBC
HEMATOCRIT: 36.6 % (ref 36.0–46.0)
Hemoglobin: 11.8 g/dL — ABNORMAL LOW (ref 12.0–15.0)
MCH: 28.7 pg (ref 26.0–34.0)
MCHC: 32.2 g/dL (ref 30.0–36.0)
MCV: 89.1 fL (ref 78.0–100.0)
PLATELETS: 171 10*3/uL (ref 150–400)
RBC: 4.11 MIL/uL (ref 3.87–5.11)
RDW: 14.5 % (ref 11.5–15.5)
WBC: 8.5 10*3/uL (ref 4.0–10.5)

## 2014-03-28 LAB — GLUCOSE, CAPILLARY: Glucose-Capillary: 66 mg/dL — ABNORMAL LOW (ref 70–99)

## 2014-03-28 MED ORDER — DEXTROSE-NACL 5-0.9 % IV SOLN
INTRAVENOUS | Status: DC
  Administered 2014-03-28 – 2014-03-31 (×6): via INTRAVENOUS

## 2014-03-28 MED ORDER — SODIUM CHLORIDE 0.9 % IV SOLN
INTRAVENOUS | Status: DC
  Administered 2014-03-28: 04:00:00 via INTRAVENOUS

## 2014-03-28 NOTE — Progress Notes (Addendum)
[facility] PROGRESS NOTE  Ana Fisher GEX:[id number] DOB: 1923-05-02 DOA: 03/28/2014 PCP: [name], MD  Brief narrative: 80 year old female with past medical history of breast cancer, ostearthritis, chronic diastolic CHF (2 D ECHO in 11/2013 showed EF 55%) who presented to [facility] ED 03/31/2014 with worsening abdominal pain and distention for past few days prior to this admission with associated nausea and vomiting.  In ED, vitals are stable with BP of 141/72, HR 77, T max 98.6 F and oxygen saturation of 91 - 99% on 2 L  oxygen support. Abdominal X ray showed possible cecal volvulus and this was confirmed on CT abdomen.  Assessment and Plan:   Active Problems:  Cecal volvulus  - appreciate surgery consult and recommendations  - pt has cecostomy tube placed 04/07/2014 by IR - may try clear liquids today  - continue antiemetics as needed, analgesia as needed  - palliative care consulted for goals of care, awaiting their input  Hyponatremia  - mild, likely dehydration or lasix  - has resolved to normal with IV fluids  Chronic CHF, diastolic  - BNP elevated at 1262 but pt dehydrated clinically so lasix on hold while we are giving IV fluids  - 2 D ECHO in 11/2013 showed normal EF  Moderate protein calorie malnutrition - secondary to malignancy - clear liquids today   Code Status: Full  Family Communication: family at the bedside  Disposition Plan: home when stable    Robbie Lis, MD  [facility] Pager [phone]  If 7PM-7AM, please contact night-coverage [email or web address] Password [email or web address] 03/28/2014, 6:04 PM   LOS: 1 day   Consultants:  Surgery  IR  Procedures:  Cecostomy tube placed 04/13/2014   Antibiotics:  None   HPI/Subjective: Feels more comfortable this am.   Objective: Filed Vitals:   03/28/14 0121 03/28/14 0609 03/28/14 0620 03/28/14 1400  BP: 120/70 108/60  117/66  Pulse: 105 98  89  Temp:  98.8 F (37.1 C)  97.7 F (36.5 C)  TempSrc:   Oral  Oral  Resp: 17 18  18   Height:   5' 2.5" (1.588 m)   Weight:  59 kg (130 lb 1.1 oz)    SpO2: 97% 97%  95%    Intake/Output Summary (Last 24 hours) at 03/28/14 1804 Last data filed at 03/28/14 0615  Gross per 24 hour  Intake   1935 ml  Output      0 ml  Net   1935 ml    Exam:   General:  Pt is alert, follows commands appropriately, not in acute distress  Cardiovascular: Regular rate and rhythm, S1/S2, no murmurs, no rubs, no gallops  Respiratory: Clear to auscultation bilaterally, no wheezing, no crackles, no rhonchi  Abdomen: less distended, cecostomy tube placed, bowel sounds present, no guarding  Extremities:LE edema, pulses DP and PT palpable bilaterally  Neuro: Grossly nonfocal  Data Reviewed: Basic Metabolic Panel:  Recent Labs Lab 04/15/2014 1105 04/23/2014 1411 04/14/2014 1744 03/28/14 0500  NA 132*  --  133* 137  K 4.1  --  4.5 4.4  CL 93*  --  95* 101  CO2 25  --  25 25  GLUCOSE 89  --  79 61*  BUN 24*  --  23 21  CREATININE 0.66  --  0.68 0.67  CALCIUM 9.3  --  9.3 8.5  MG  --  1.9  --   --   PHOS  --   --  3.5  --  Liver Function Tests:  Recent Labs Lab 03/28/2014 1105 04/19/2014 1744 03/28/14 0500  AST 28 34 31  ALT 17 16 15   ALKPHOS 124* 135* 128*  BILITOT 0.7 0.8 0.7  PROT 6.4 5.9* 5.2*  ALBUMIN 3.1* 2.9* 2.3*    Recent Labs Lab 04/05/2014 1105  LIPASE 480*   No results found for this basename: AMMONIA,  in the last 168 hours CBC:  Recent Labs Lab 04/14/2014 1105 04/21/2014 1744 03/28/14 0500  WBC 10.4 12.2* 8.5  NEUTROABS 8.5* 9.8*  --   HGB 12.9 13.4 11.8*  HCT 38.5 39.8 36.6  MCV 87.3 87.9 89.1  PLT 191 186 171   Cardiac Enzymes: No results found for this basename: CKTOTAL, CKMB, CKMBINDEX, TROPONINI,  in the last 168 hours BNP: No components found with this basename: POCBNP,  CBG:  Recent Labs Lab 03/28/14 0625  GLUCAP 66*    No results found for this or any previous visit (from the past 240 hour(s)).    Studies: Ct Abdomen Pelvis Wo Contrast 04/06/2014    IMPRESSION: Findings consistent with a cecal volvulus with areas concerning for pneumatosis in the nondependent wall of the bowel. Critical Value/emergent results were called by telephone at the time of interpretation on 04/01/2014 at 6:16 PM to Dr. Barkley Bruns, who verbally acknowledged these results.  A compensatory small bowel obstruction is appreciated.  Diffuse ascites  Bibasilar interstitial infiltrates and small effusions. Differential considerations include pulmonary edema, infectious interstitial infiltrates, lymphangitic neoplastic infiltration.  Findings consistent with diffuse metastatic disease within the liver until proven otherwise and concerning for metastatic pulmonary nodule right lung base.  Bilateral nephrolithiasis without hydronephrosis.     Ir Ceco/colonic Tube Insert Percut W/fluoro Mod Sed 03/28/2014 IMPRESSION: Successful fluoroscopic insertion of an 65 French balloon retention cecostomy tube.  The cecostomy tube will be placed to low wall suction overnight and then as directed by the ordering surgical service.     Dg Abd Acute W/chest 04/17/2014   IMPRESSION: COPD changes without acute infiltrate.  Marked gaseous distention of the cecum 12.4 cm diameter with paucity of distal colonic gas highly suspicious for cecal volvulus.     Scheduled Meds:  Continuous Infusions: . dextrose 5 % and 0.9% NaCl 100 mL/hr at 03/28/14 [phone]

## 2014-03-28 NOTE — Progress Notes (Signed)
.  Patient [name]      DOB: 10/24/1923      KDT:[id number]  Multiple patient visits ahead of [name].  Earliest visit will be first thing Am Sunday but daughter not available at that time.  Plan to meet at 1 pm, Sunday.   Ana Yellowhair L. Lovena Fisher, Ana Fisher The Palliative Medicine Team at [facility] Phone: [phone] Pager: [phone]

## 2014-03-28 NOTE — Progress Notes (Signed)
Patient ID: Ana Fisher, female   DOB: January 21, 1923, 80 y.o.   MRN: [id number]  [facility] - Progress Note  Subjective: Patient more comfortable this AM.  Daughter at bedside.  Cecostomy tube placed by interventional radiology last night.  Objective: Vital signs in last 24 hours: Temp:  [97.7 F (36.5 C)-98.8 F (37.1 C)] 98.8 F (37.1 C) (05/02 0609) Pulse Rate:  [77-105] 98 (05/02 0609) Resp:  [7-30] 18 (05/02 0609) BP: (108-162)/(60-78) 108/60 mmHg (05/02 0609) SpO2:  [91 %-99 %] 97 % (05/02 0609) Weight:  [130 lb 1.1 oz (59 kg)] 130 lb 1.1 oz (59 kg) (05/02 0609) Last BM Date: 03/26/14  Intake/Output from previous day: 05/01 0701 - 05/02 0700 In: 2028.8 [I.V.:1303.8] Out: -   Exam: HEENT - clear, not icteric Neck - soft Chest - clear bilaterally Cor - RRR, no murmur Abd - softer, less distended; moderate tenderness diffusely; cecostomy tube with thick brown output Ext - no significant edema Neuro - grossly intact, no focal deficits  Lab Results:   Recent Labs  03/28/2014 1744 03/28/14 0500  WBC 12.2* 8.5  HGB 13.4 11.8*  HCT 39.8 36.6  PLT 186 171     Recent Labs  04/18/2014 1744 03/28/14 0500  NA 133* 137  K 4.5 4.4  CL 95* 101  CO2 25 25  GLUCOSE 79 61*  BUN 23 21  CREATININE 0.68 0.67  CALCIUM 9.3 8.5    Studies/Results: Ct Abdomen Pelvis Wo Contrast  04/07/2014   CLINICAL DATA:  evaluate for cecal volvulus  EXAM: CT ABDOMEN AND PELVIS WITHOUT CONTRAST  TECHNIQUE: Multidetector CT imaging of the abdomen and pelvis was performed following the standard protocol without intravenous contrast.  COMPARISON:  DG ABD ACUTE W/CHEST dated 04/16/2014  FINDINGS: Small bilateral effusions are appreciated within the lung bases. There is diffuse thickening of the interstitial markings with areas of tree-in-bud configuration. Areas of increased density are in the dependent portions of the lung bases. A 1.5 cm nodule is appreciated  within the right lung base concerning for metastatic disease considering the patient's history.  Diffuse ascites is appreciated within the abdomen. There is diffuse dilation of the cecum consistent with the patient's history of cecal volvulus with maximum diameters of 11 cm. There are areas concerning for pneumatosis within the bowel wall in the nondependent posterior portion of the cecum images 41 through 51. The transition point appears to be within the pelvis on image 39 coronal series and images 55-57 series 2. Multiple compensatory dilated loops of small bowel are appreciated within the lower abdomen and pelvis containing fluid and air.  The liver is atrophic and contains multiple low attenuating masses primarily in the right lobe. There is diffuse periportal edema.  Evaluation of the right kidney demonstrates a nonobstructing 6 mm calculus medullary portion of the lower pole. There are multiple multiple calculi along the course of the mid ureter on the right. A nonobstructing medullary calculus identified within the left kidney. There is no evidence of hydronephrosis on the right or left.  The spleen, pancreas, adrenals are unremarkable.  Atherosclerotic calcifications are identified within aorta. There is no evidence of an abdominal aortic aneurysm.  Multilevel spondylosis is appreciated within the spine. There does not appear to be aggressive appearing osseous lesions. Compression deformities are appreciated within the lower lumbar spine.  IMPRESSION: Findings consistent with a cecal volvulus with areas concerning for pneumatosis in the nondependent wall of the bowel. Critical Value/emergent results were  called by telephone at the time of interpretation on 03/31/2014 at 6:16 PM to Dr. Barkley Bruns, who verbally acknowledged these results.  A compensatory small bowel obstruction is appreciated.  Diffuse ascites  Bibasilar interstitial infiltrates and small effusions. Differential considerations include pulmonary  edema, infectious interstitial infiltrates, lymphangitic neoplastic infiltration.  Findings consistent with diffuse metastatic disease within the liver until proven otherwise and concerning for metastatic pulmonary nodule right lung base.  Bilateral nephrolithiasis without hydronephrosis.   Electronically Signed   By: Margaree Mackintosh M.D.   On: 04/25/2014 18:19   Dg Abd Acute W/chest  04/25/2014   CLINICAL DATA:  Lower abdominal pain, abdominal distention, history chronic constipation, breast cancer, smoking  EXAM: ACUTE ABDOMEN SERIES (ABDOMEN 2 VIEW & CHEST 1 VIEW)  COMPARISON:  Chest radiographs 03/18/2014, no prior abdominal radiographs for comparison  FINDINGS: Normal heart size, mediastinal contours, and pulmonary vascularity.  Density in RIGHT mid lung is unchanged question related to breast prosthesis, unchanged.  Underlying emphysematous changes.  Atherosclerotic calcification aorta.  No definite infiltrate, pleural effusion or pneumothorax.  Diffuse osseous demineralization.  Gaseous distention cecum, 12.4 cm diameter.  Minimal stool in proximal descending colon and rectum with out additional colonic gas.  Air-filled loops of upper normal caliber small bowel throughout abdomen.  No definite evidence of bowel obstruction, bowel wall thickening or free air.  No urinary tract calcification.  IMPRESSION: COPD changes without acute infiltrate.  Marked gaseous distention of the cecum 12.4 cm diameter with paucity of distal colonic gas highly suspicious for cecal volvulus.  Findings called to Dr. Alvino Chapel on 04/17/2014 at 1211 hr.   Electronically Signed   By: Lavonia Dana M.D.   On: 04/07/2014 12:12    Assessment / Plan: 1.  Colonic obstruction - cecal volvulus versus malignant obstruction  Cecostomy tube place percutaneously by IR last night  Will irrigate and keep on intermittent suction  Allow clear liquid diet  End of life care / issues to be addressed by palliative care and case manager and social  work with patient and family.  Will follow.  Earnstine Regal, MD, [facility] Office: [phone]  03/28/2014

## 2014-03-29 ENCOUNTER — Encounter: Payer: Self-pay | Admitting: Internal Medicine

## 2014-03-29 MED ORDER — HYDROMORPHONE HCL PF 1 MG/ML IJ SOLN
0.5000 mg | INTRAMUSCULAR | Status: DC | PRN
Start: 2014-03-29 — End: 2014-03-29
  Administered 2014-03-29: 0.5 mg via INTRAVENOUS
  Filled 2014-03-29: qty 1

## 2014-03-29 MED ORDER — HYDROMORPHONE HCL PF 1 MG/ML IJ SOLN
0.7500 mg | INTRAMUSCULAR | Status: DC | PRN
  Administered 2014-03-29 – 2014-03-30 (×3): 0.75 mg via INTRAVENOUS
  Filled 2014-03-29 (×3): qty 1

## 2014-03-29 NOTE — Consult Note (Signed)
Patient ZS:[id number] A Sattar      DOB: Jul 08, 1923      FTD:[id number]     Consult Note from the Palliative Medicine Team at [place] Requested by: Dr. Charlies Silvers    PCP: [name], MD Reason for Consultation: [place]    Phone Number:[phone]  Assessment of patients Current state: 80 yr old with multiple chronic medical illnesses present with several weeks to months of intermittent abdominal pain now with acute onset severe pain and distention.  Patient found to have a cecal volvulous with likely obstructing distal mass.  Patient deemed non-operative.  Lu has been a fixture a [employer] for over 30 yrs.  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  Lu talked with me for over an hour and reminiscend about her contributions to her community and family.  She reports understanding the state of her condition and wants to focus on comfort at the end of her life.  She states "you should not ask a person how they are doing when you meet you should ask what is it like to be you?"  Lu continued to try to find meaning in her circumstances.  We talked about using hospice to help care for her .  She was worried about leaving this world with debt.  I assured her she had been paying for her hospice care for year by contributing to her medicare.  Her daughters were present and grateful to review options for her care.  See my note from the date of service.   Goals of Care: 1.  Code Status: DNR   2. Scope of Treatment: Focus on comfort.  Continue abx for now. Explore options for sight of end of life care.   4. Disposition: to be determined. She would rather be at her condo but likely could not have 24/7 care.  She has connection to [facility] so will check with SW on whether this would be an option for her . Otherwise, would consider hospice facility placement.   3. Symptom Management:   1. Anxiety/Agitation: prn ativan would be appropriate 2. Pain: Morphine as been  stimulating her to stay awake.  Will switch to dilaudid low dose  Prn.  She may need continuous opiates at some point. 3. Bowel Regimen: monitor 4. Delirium: could become an issue based on the severity of her illness. Prn haldol if needed 5. Fever: tylenol prn 6. Nausea/Vomiting: prn zofran 7. Terminal Secretions: can add atropine, scop, and may need octreotide if she has copious gi secretion.  4. Psychosocial: Lu worked for [employer] for over 30 yrs retiring only 2 yrs ago when the hospital was closing her post and her health was declining. 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 She was always going and working to help others . She has two daughters and two sons  63. Spiritual: Chaplain services offered.        Patient Documents Completed or Given: Document Given Completed  Advanced Directives Pkt    MOST    DNR    Gone from My Sight    Hard Choices      Brief HPI: 80 yr old white female with  PMH for breast cancer, CHF, diastolic heart failure presented with abdomen pain found to have nonoperative cecal volvulus like with obstructing mass.  We were asked to assist with symptom management and goals of care.   ROS: abdomen pain, nausea, no vomting  PMH:  Past Medical History  Diagnosis Date  . Goiter, nontoxic, multinodular   . Urinary incontinence   . Ankle edema 2009  . Constipation   . Hx of colonic polyps   . Osteoporosis   . Breast cancer   . Escherichia coli urinary tract infection 07/27/13    Cipro prescribed by [facility] urgent care     PSH: Past Surgical History  Procedure Laterality Date  . Appendectomy    . Colonoscopy w/ polypectomy    . Abdominal hysterectomy    . Bilateral salpingoophorectomy    . Mastectomy      bilateral  . Tonsillectomy    . Total knee arthroplasty    . Cataract extraction, bilateral     I have reviewed the FH and SH and  If appropriate update it with new information. Allergies  Allergen Reactions  . Penicillins      Hives Because of a history of documented adverse serious drug reaction;Medi Alert bracelet  is recommended  . Sulfonamide Derivatives     She denies allergy to Sulfa   Scheduled Meds:  Continuous Infusions: . dextrose 5 % and 0.9% NaCl 100 mL/hr at 03/29/14 0507   PRN Meds:.acetaminophen, acetaminophen, HYDROmorphone (DILAUDID) injection, ondansetron (ZOFRAN) IV    BP 144/73  Pulse 87  Temp(Src) 98.3 F (36.8 C) (Oral)  Resp 18  Ht 5' 2.5" (1.588 m)  Wt 54.4 kg (119 lb 14.9 oz)  BMI 21.57 kg/m2  SpO2 92%   PPS: 30-40%   Intake/Output Summary (Last 24 hours) at 03/29/14 1540 Last data filed at 03/28/14 1823  Gross per 24 hour  Intake    200 ml  Output    350 ml  Net   -150 ml    Physical Exam:  General: awake , alert and very chatty.mild to moderate distress related to abdomen pain HEENT: PERRL, EOMI, mmdry Chest:   Decreased but clear, no wheezing CVS: regular, S1, S2 Abdomen:distended, tender, without guarding, fast bowel sounds, drain in place Ext: bilateral lower extremity edema with joint deformities Neuro:orient to self and family off on time slightly but able to recount present and past history  Labs: CBC    Component Value Date/Time   WBC 8.5 03/28/2014 0500   RBC 4.11 03/28/2014 0500   HGB 11.8* 03/28/2014 0500   HCT 36.6 03/28/2014 0500   PLT 171 03/28/2014 0500   MCV 89.1 03/28/2014 0500   MCH 28.7 03/28/2014 0500   MCHC 32.2 03/28/2014 0500   RDW 14.5 03/28/2014 0500   LYMPHSABS 1.4 Apr 02, 2014 1744   MONOABS 0.9 03/31/2014 1744   EOSABS 0.0 04/04/2014 1744   BASOSABS 0.0 04/11/2014 1744       CMP     Component Value Date/Time   NA 137 03/28/2014 0500   K 4.4 03/28/2014 0500   CL 101 03/28/2014 0500   CO2 25 03/28/2014 0500   GLUCOSE 61* 03/28/2014 0500   BUN 21 03/28/2014 0500   CREATININE 0.67 03/28/2014 0500   CALCIUM 8.5 03/28/2014 0500   PROT 5.2* 03/28/2014 0500   ALBUMIN 2.3* 03/28/2014 0500   AST 31 03/28/2014 0500   ALT 15 03/28/2014 0500   ALKPHOS 128* 03/28/2014 0500    BILITOT 0.7 03/28/2014 0500   GFRNONAA 76* 03/28/2014 0500   GFRAA 88* 03/28/2014 0500    Chest Xray Reviewed/Impressions: COPD changes without acute infiltrate.  Marked gaseous distention of the cecum 12.4 cm diameter with paucity  of distal colonic gas highly suspicious for cecal volvulus.  CT scan of the Head Reviewed/Impressions:  Findings consistent with a cecal volvulus with areas concerning for  pneumatosis in the nondependent wall of the bowel. Critical  Value/emergent results were called by telephone at the time of  interpretation on 04/19/2014 at 6:16 PM to Dr. Barkley Bruns, who  verbally acknowledged these results.  A compensatory small bowel obstruction is appreciated.  Diffuse ascites  Bibasilar interstitial infiltrates and small effusions. Differential  considerations include pulmonary edema, infectious interstitial  infiltrates, lymphangitic neoplastic infiltration.  Findings consistent with diffuse metastatic disease within the liver  until proven otherwise and concerning for metastatic pulmonary  nodule right lung base.  Bilateral nephrolithiasis without hydronephrosis.      Time In Time Out Total Time Spent with Patient Total Overall Time  110 pm 240 pm 90 min 90 min    Greater than 50%  of this time was spent counseling and coordinating care related to the above assessment and plan.  Mischele Detter L. Lovena Le, MD MBA The Palliative Medicine Team at [facility] Phone: [phone] Pager: [phone]

## 2014-03-29 NOTE — Progress Notes (Signed)
Subjective: Cecostomy tube placed 5/1 Pt feels so much better   Objective: Vital signs in last 24 hours: Temp:  [97.7 F (36.5 C)-98.9 F (37.2 C)] 98.1 F (36.7 C) (05/03 0534) Pulse Rate:  [89-100] 99 (05/03 0534) Resp:  [18] 18 (05/03 0534) BP: (117-141)/(66-76) 141/76 mmHg (05/03 0534) SpO2:  [95 %-97 %] 95 % (05/03 0534) Weight:  [54.4 kg (119 lb 14.9 oz)] 54.4 kg (119 lb 14.9 oz) (05/03 0534) Last BM Date: 03/26/14  Intake/Output from previous day: 05/02 0701 - 05/03 0700 In: 200 [P.O.:200] Out: 350 [Urine:350] Intake/Output this shift:    PE:  Afeb; vss Pt still some abd distension- but better   Lab Results:   Recent Labs  Apr 03, 2014 1744 03/28/14 0500  WBC 12.2* 8.5  HGB 13.4 11.8*  HCT 39.8 36.6  PLT 186 171   BMET  Recent Labs  04/10/2014 1744 03/28/14 0500  NA 133* 137  K 4.5 4.4  CL 95* 101  CO2 25 25  GLUCOSE 79 61*  BUN 23 21  CREATININE 0.68 0.67  CALCIUM 9.3 8.5   PT/INR  Recent Labs  04/15/2014 1744  LABPROT 14.3  INR 1.13   ABG No results found for this basename: PHART, PCO2, PO2, HCO3,  in the last 72 hours  Studies/Results: Ct Abdomen Pelvis Wo Contrast  Apr 03, 2014   CLINICAL DATA:  evaluate for cecal volvulus  EXAM: CT ABDOMEN AND PELVIS WITHOUT CONTRAST  TECHNIQUE: Multidetector CT imaging of the abdomen and pelvis was performed following the standard protocol without intravenous contrast.  COMPARISON:  DG ABD ACUTE W/CHEST dated 04/04/2014  FINDINGS: Small bilateral effusions are appreciated within the lung bases. There is diffuse thickening of the interstitial markings with areas of tree-in-bud configuration. Areas of increased density are in the dependent portions of the lung bases. A 1.5 cm nodule is appreciated within the right lung base concerning for metastatic disease considering the patient's history.  Diffuse ascites is appreciated within the abdomen. There is diffuse dilation of the cecum consistent with the patient's  history of cecal volvulus with maximum diameters of 11 cm. There are areas concerning for pneumatosis within the bowel wall in the nondependent posterior portion of the cecum images 41 through 51. The transition point appears to be within the pelvis on image 39 coronal series and images 55-57 series 2. Multiple compensatory dilated loops of small bowel are appreciated within the lower abdomen and pelvis containing fluid and air.  The liver is atrophic and contains multiple low attenuating masses primarily in the right lobe. There is diffuse periportal edema.  Evaluation of the right kidney demonstrates a nonobstructing 6 mm calculus medullary portion of the lower pole. There are multiple multiple calculi along the course of the mid ureter on the right. A nonobstructing medullary calculus identified within the left kidney. There is no evidence of hydronephrosis on the right or left.  The spleen, pancreas, adrenals are unremarkable.  Atherosclerotic calcifications are identified within aorta. There is no evidence of an abdominal aortic aneurysm.  Multilevel spondylosis is appreciated within the spine. There does not appear to be aggressive appearing osseous lesions. Compression deformities are appreciated within the lower lumbar spine.  IMPRESSION: Findings consistent with a cecal volvulus with areas concerning for pneumatosis in the nondependent wall of the bowel. Critical Value/emergent results were called by telephone at the time of interpretation on Apr 03, 2014 at 6:16 PM to Dr. Barkley Bruns, who verbally acknowledged these results.  A compensatory small bowel obstruction is appreciated.  Diffuse  ascites  Bibasilar interstitial infiltrates and small effusions. Differential considerations include pulmonary edema, infectious interstitial infiltrates, lymphangitic neoplastic infiltration.  Findings consistent with diffuse metastatic disease within the liver until proven otherwise and concerning for metastatic pulmonary  nodule right lung base.  Bilateral nephrolithiasis without hydronephrosis.   Electronically Signed   By: Margaree Mackintosh M.D.   On: 04/22/2014 18:19   Ir Ceco/colonic Tube Insert Percut W/fluoro Mod Sed  03/28/2014   INDICATION: Recent diagnosis of suspected obstructing ascending colonic mass with multiple hepatic and pulmonary lesions worrisome for metastatic disease. Additionally, the cecum is markedly dilated with findings of pneumatosis. The patient is a poor endoscopy and surgical candidate and as such, the request has been made for placement of a decompressive cecostomy tube for palliation of symptoms.  EXAM: FLUOROSCOPIC GUIDED PUSH CECOSTOMY TUBE PLACEMENT  COMPARISON:  CT ABD/PELV WO CM dated 04/26/2014  MEDICATIONS: Ciprofloxacin 400 mg IV  Antibiotics were administered within 1 hour of the procedure.  CONTRAST:  Approximately 20 mL of Isovue 300 administered into the cecum  ANESTHESIA/SEDATION: Versed 1 mg IV; Fentanyl 50 mcg IV.  Sedation time  15 minutes  FLUOROSCOPY TIME:  1 minute, 24 seconds.  COMPLICATIONS: None immediate  PROCEDURE: Informed written consent was obtained from the patient and the patient's family following explanation of the procedure, risks, benefits and alternatives. A time out was performed prior to the initiation of the procedure. Maximal barrier sterile technique utilized including caps, mask, sterile gowns, sterile gloves, large sterile drape, hand hygiene and Betadine prep.  The right lower abdominal quadrant was sterilely prepped and draped. A preprocedural fluoroscopic image was obtained demonstrating persistent marked gas distention of the cecum within the right lower abdominal quadrant. Under sterile conditions and local anesthesia, 3 T tacks were utilized to pexy the anterior lateral aspect of the cecum against the ventral abdominal wall. Contrast injection confirmed appropriate positioning of each of the T tacks. An incision was made between the T tacks and a 17 gauge  trocar needle was utilized to access the cecum. Needle position was confirmed within the cecum with aspiration of air and injection of a small amount of contrast. A stiff Amplatz was advanced into the cecum and under intermittent fluoroscopic guidance a telescoping peel-away sheath was placed ultimately allowing placement of an 18-French balloon retention cecostomy tube. The retention balloon was insufflated with a mixture of dilute saline and contrast and pulled taut against the anterior lateral wall of the cecum. The external disc was cinched. Contrast injection confirms positioning within the cecum. A postprocedural spot fluoroscopic image was obtained. The patient tolerated procedure well without immediate post procedural complication.  FINDINGS: After successful fluoroscopic guided placement, the cecostomy tube is appropriately positioned with internal retention balloon against the ventral aspect of the cecum.  IMPRESSION: Successful fluoroscopic insertion of an 41 French balloon retention cecostomy tube.  The cecostomy tube will be placed to low wall suction overnight and then as directed by the ordering surgical service.   Electronically Signed   By: Sandi Mariscal M.D.   On: 03/28/2014 08:53    Anti-infectives: Anti-infectives   Start     Dose/Rate Route Frequency Ordered Stop   04/12/2014 2145  ciprofloxacin (CIPRO) IVPB 400 mg  Status:  Discontinued     400 mg 200 mL/hr over 60 Minutes Intravenous  Once 04/23/2014 2214 04/16/2014 2216   04/21/2014 2137  ciprofloxacin (CIPRO) 400 MG/200ML IVPB    Comments:  Alfred Levins   : cabinet override  04/13/2014 2137 Apr 03, 2014 2145      Assessment/Plan: s/p * No surgery found * Cecostomy tube in place Doing well For palliative/hospice consult today   LOS: 2 days    Lavonia Drafts 03/29/2014

## 2014-03-29 NOTE — Consult Note (Signed)
Patient TY:[id number] A Arntz      DOB: 08-Nov-1923      EIH:[id number]  Summary of goals of care: full note to follow:  Met with patient and two daughter Rick Duff and Junie Panning   Patient found to have new colon cancer and mass.  She relates her biggest worry at the moment is getting to heaven and not leaving this earth with bills.   We talked about her current state.  She wants to dying peacefully without suffering.  She desires a DNR and makes this clear to her daugher who is HCPOA.  She gave [place] permission to check on placing her with hospice either at a hospice home or at SNF with hospice.  Family understands where we are at in her illness .  Prognosis likely weeks to month at best but could switch to days very quickly   Recommned:  1. Change to DNR  2.  Pain control.  Morphine makes her hypomanic. Can try low dose diluadid.  She prefers prn dosing for now.  3.  Decreased IV just slightly  4.  Will check with SW in am about [place].  5.  Obtain priest from [place] to visit   Time:  110 pm- 240 pm  Reese Senk L. Lovena Le, MD MBA The Palliative Medicine Team at [facility] Phone: [phone] Pager: [phone]

## 2014-03-29 NOTE — Progress Notes (Signed)
CSW consult received for placement, however it's unclear what type of placement.  Chart reviewed and noted that PMT MD, Dr. Lovena Le, to meet with Pt and family today at 77.  CSW to will continue to follow for d/c needs.  Bernita Raisin, [place] [facility] [phone]

## 2014-03-29 NOTE — Progress Notes (Signed)
Patient ID: Ana Fisher, female   DOB: Apr 07, 1923, 80 y.o.   MRN: [id number]  General Surgery - [facility] - Progress Note  Subjective: Patient comfortable.  Slept 3 hours last night.  Daughter at bedside.  Objective: Vital signs in last 24 hours: Temp:  [97.7 F (36.5 C)-98.9 F (37.2 C)] 98.1 F (36.7 C) (05/03 0534) Pulse Rate:  [89-100] 99 (05/03 0534) Resp:  [18] 18 (05/03 0534) BP: (117-141)/(66-76) 141/76 mmHg (05/03 0534) SpO2:  [95 %-97 %] 95 % (05/03 0534) Weight:  [119 lb 14.9 oz (54.4 kg)] 119 lb 14.9 oz (54.4 kg) (05/03 0534) Last BM Date: 03/26/14  Intake/Output from previous day: 05/02 0701 - 05/03 0700 In: 200 [P.O.:200] Out: 350 [Urine:350]  Exam: HEENT - clear, not icteric Abd - markedly distended; active BS present; diffuse mild tenderness; thin brown in cecostomy tube  Lab Results:   Recent Labs  04/17/2014 1744 03/28/14 0500  WBC 12.2* 8.5  HGB 13.4 11.8*  HCT 39.8 36.6  PLT 186 171     Recent Labs  04/08/2014 1744 03/28/14 0500  NA 133* 137  K 4.5 4.4  CL 95* 101  CO2 25 25  GLUCOSE 79 61*  BUN 23 21  CREATININE 0.68 0.67  CALCIUM 9.3 8.5    Studies/Results: Ct Abdomen Pelvis Wo Contrast  04/01/2014   CLINICAL DATA:  evaluate for cecal volvulus  EXAM: CT ABDOMEN AND PELVIS WITHOUT CONTRAST  TECHNIQUE: Multidetector CT imaging of the abdomen and pelvis was performed following the standard protocol without intravenous contrast.  COMPARISON:  DG ABD ACUTE W/CHEST dated Apr 03, 2014  FINDINGS: Small bilateral effusions are appreciated within the lung bases. There is diffuse thickening of the interstitial markings with areas of tree-in-bud configuration. Areas of increased density are in the dependent portions of the lung bases. A 1.5 cm nodule is appreciated within the right lung base concerning for metastatic disease considering the patient's history.  Diffuse ascites is appreciated within the abdomen. There is diffuse  dilation of the cecum consistent with the patient's history of cecal volvulus with maximum diameters of 11 cm. There are areas concerning for pneumatosis within the bowel wall in the nondependent posterior portion of the cecum images 41 through 51. The transition point appears to be within the pelvis on image 39 coronal series and images 55-57 series 2. Multiple compensatory dilated loops of small bowel are appreciated within the lower abdomen and pelvis containing fluid and air.  The liver is atrophic and contains multiple low attenuating masses primarily in the right lobe. There is diffuse periportal edema.  Evaluation of the right kidney demonstrates a nonobstructing 6 mm calculus medullary portion of the lower pole. There are multiple multiple calculi along the course of the mid ureter on the right. A nonobstructing medullary calculus identified within the left kidney. There is no evidence of hydronephrosis on the right or left.  The spleen, pancreas, adrenals are unremarkable.  Atherosclerotic calcifications are identified within aorta. There is no evidence of an abdominal aortic aneurysm.  Multilevel spondylosis is appreciated within the spine. There does not appear to be aggressive appearing osseous lesions. Compression deformities are appreciated within the lower lumbar spine.  IMPRESSION: Findings consistent with a cecal volvulus with areas concerning for pneumatosis in the nondependent wall of the bowel. Critical Value/emergent results were called by telephone at the time of interpretation on 04/14/2014 at 6:16 PM to Dr. Barkley Bruns, who verbally acknowledged these results.  A compensatory small bowel obstruction is appreciated.  Diffuse ascites  Bibasilar interstitial infiltrates and small effusions. Differential considerations include pulmonary edema, infectious interstitial infiltrates, lymphangitic neoplastic infiltration.  Findings consistent with diffuse metastatic disease within the liver until proven  otherwise and concerning for metastatic pulmonary nodule right lung base.  Bilateral nephrolithiasis without hydronephrosis.   Electronically Signed   By: Margaree Mackintosh M.D.   On: 04/19/2014 18:19   Ir Ceco/colonic Tube Insert Percut W/fluoro Mod Sed  03/28/2014   INDICATION: Recent diagnosis of suspected obstructing ascending colonic mass with multiple hepatic and pulmonary lesions worrisome for metastatic disease. Additionally, the cecum is markedly dilated with findings of pneumatosis. The patient is a poor endoscopy and surgical candidate and as such, the request has been made for placement of a decompressive cecostomy tube for palliation of symptoms.  EXAM: FLUOROSCOPIC GUIDED PUSH CECOSTOMY TUBE PLACEMENT  COMPARISON:  CT ABD/PELV WO CM dated 04/18/2014  MEDICATIONS: Ciprofloxacin 400 mg IV  Antibiotics were administered within 1 hour of the procedure.  CONTRAST:  Approximately 20 mL of Isovue 300 administered into the cecum  ANESTHESIA/SEDATION: Versed 1 mg IV; Fentanyl 50 mcg IV.  Sedation time  15 minutes  FLUOROSCOPY TIME:  1 minute, 24 seconds.  COMPLICATIONS: None immediate  PROCEDURE: Informed written consent was obtained from the patient and the patient's family following explanation of the procedure, risks, benefits and alternatives. A time out was performed prior to the initiation of the procedure. Maximal barrier sterile technique utilized including caps, mask, sterile gowns, sterile gloves, large sterile drape, hand hygiene and Betadine prep.  The right lower abdominal quadrant was sterilely prepped and draped. A preprocedural fluoroscopic image was obtained demonstrating persistent marked gas distention of the cecum within the right lower abdominal quadrant. Under sterile conditions and local anesthesia, 3 T tacks were utilized to pexy the anterior lateral aspect of the cecum against the ventral abdominal wall. Contrast injection confirmed appropriate positioning of each of the T tacks. An  incision was made between the T tacks and a 17 gauge trocar needle was utilized to access the cecum. Needle position was confirmed within the cecum with aspiration of air and injection of a small amount of contrast. A stiff Amplatz was advanced into the cecum and under intermittent fluoroscopic guidance a telescoping peel-away sheath was placed ultimately allowing placement of an 18-French balloon retention cecostomy tube. The retention balloon was insufflated with a mixture of dilute saline and contrast and pulled taut against the anterior lateral wall of the cecum. The external disc was cinched. Contrast injection confirms positioning within the cecum. A postprocedural spot fluoroscopic image was obtained. The patient tolerated procedure well without immediate post procedural complication.  FINDINGS: After successful fluoroscopic guided placement, the cecostomy tube is appropriately positioned with internal retention balloon against the ventral aspect of the cecum.  IMPRESSION: Successful fluoroscopic insertion of an 40 French balloon retention cecostomy tube.  The cecostomy tube will be placed to low wall suction overnight and then as directed by the ordering surgical service.   Electronically Signed   By: Sandi Mariscal M.D.   On: 03/28/2014 08:53   Dg Abd Acute W/chest  03/31/2014   CLINICAL DATA:  Lower abdominal pain, abdominal distention, history chronic constipation, breast cancer, smoking  EXAM: ACUTE ABDOMEN SERIES (ABDOMEN 2 VIEW & CHEST 1 VIEW)  COMPARISON:  Chest radiographs 03/18/2014, no prior abdominal radiographs for comparison  FINDINGS: Normal heart size, mediastinal contours, and pulmonary vascularity.  Density in RIGHT mid lung is unchanged question related to breast prosthesis, unchanged.  Underlying emphysematous changes.  Atherosclerotic calcification aorta.  No definite infiltrate, pleural effusion or pneumothorax.  Diffuse osseous demineralization.  Gaseous distention cecum, 12.4 cm  diameter.  Minimal stool in proximal descending colon and rectum with out additional colonic gas.  Air-filled loops of upper normal caliber small bowel throughout abdomen.  No definite evidence of bowel obstruction, bowel wall thickening or free air.  No urinary tract calcification.  IMPRESSION: COPD changes without acute infiltrate.  Marked gaseous distention of the cecum 12.4 cm diameter with paucity of distal colonic gas highly suspicious for cecal volvulus.  Findings called to Dr. Alvino Chapel on 03/29/2014 at 1211 hr.   Electronically Signed   By: Lavonia Dana M.D.   On: 03/28/2014 12:12    Assessment / Plan: 1.  Colonic obstruction, likely right colon cancer, with metastasis  Cecostomy in place, discussed care with nurse this AM  OOB to chair as tolerated  Clear liquid diet as tolerated  Palliative care meeting with Dr. Lovena Le today at 1 PM  Will follow  Earnstine Regal, MD, [facility] Office: [phone]  03/29/2014

## 2014-03-29 NOTE — Progress Notes (Signed)
[facility] PROGRESS NOTE  Ana Fisher FXT:[id number] DOB: Feb 05, 1923 DOA: 04/18/2014 PCP: [name], MD  Brief narrative: 80 year old female with past medical history of breast cancer, ostearthritis, chronic diastolic CHF (2 D ECHO in 11/2013 showed EF 55%) who presented to [facility] ED 04/08/2014 with worsening abdominal pain and distention for past few days prior to this admission with associated nausea and vomiting.  In ED, vitals are stable with BP of 141/72, HR 77, T max 98.6 F and oxygen saturation of 91 - 99% on 2 L [place] oxygen support. Abdominal X ray showed possible cecal volvulus. CT abdomen showed findings consistent with a cecal volvulus with areas concerning for pneumatosis in the nondependent wall of the bowel. Also seen is likely SBO, diffuse ascites, possible lymphangitic neoplastic infiltration, findings consistent with diffuse metastatic disease within the liver. Pt does not wish to have any surgery. She appreciates palliative care coming for goals of care.    Assessment and Plan:   Active Problems:  Cecal volvulus  - appreciate surgery consult and recommendations  - pt has cecostomy tube which was placed 04/23/2014 by IR  - had small amount of clears yesterday - continue antiemetics as needed, analgesia as needed  - palliative care consulted for goals of care today Hyponatremia  - mild, likely dehydration or lasix  - has resolved to normal with IV fluids  Chronic CHF, diastolic  - BNP elevated at 1262 but pt dehydrated clinically so lasix on hold while we are giving IV fluids  - 2 D ECHO in 11/2013 showed normal EF  Moderate protein calorie malnutrition  - secondary to malignancy  - clear liquids   Code Status: Full  Family Communication: family at the bedside  Disposition Plan: home when stable   Consultants:  Surgery  IR Palliative care Procedures:  Cecostomy tube placed 04/24/2014  Antibiotics:  None    Robbie Lis, MD  [facility] Pager  [phone]  If 7PM-7AM, please contact night-coverage [email or web address] Password [email or web address] 03/29/2014, 1:49 PM   LOS: 2 days   HPI/Subjective: No overnight events.   Objective: Filed Vitals:   03/28/14 0620 03/28/14 1400 03/28/14 2143 03/29/14 0534  BP:  117/66 129/72 141/76  Pulse:  89 100 99  Temp:  97.7 F (36.5 C) 98.9 F (37.2 C) 98.1 F (36.7 C)  TempSrc:  Oral Oral Oral  Resp:  18 18 18   Height: 5' 2.5" (1.588 m)     Weight:    54.4 kg (119 lb 14.9 oz)  SpO2:  95% 97% 95%    Intake/Output Summary (Last 24 hours) at 03/29/14 1349 Last data filed at 03/28/14 1823  Gross per 24 hour  Intake    200 ml  Output    350 ml  Net   -150 ml    Exam:   General:  Pt is alert, follows commands appropriately, not in acute distress  Cardiovascular: Regular rate and rhythm, S1/S2, no murmurs, no rubs, no gallops  Respiratory: Clear to auscultation bilaterally, no wheezing, no crackles, no rhonchi  Abdomen: more distended, cant appreciate bowel sounds   Extremities: LE +1 edema, pulses DP and PT palpable bilaterally  Neuro: Grossly nonfocal  Data Reviewed: Basic Metabolic Panel:  Recent Labs Lab 04/12/2014 1105 04/08/2014 1411 04/12/2014 1744 03/28/14 0500  NA 132*  --  133* 137  K 4.1  --  4.5 4.4  CL 93*  --  95* 101  CO2 25  --  25 25  GLUCOSE 89  --  79 61*  BUN 24*  --  23 21  CREATININE 0.66  --  0.68 0.67  CALCIUM 9.3  --  9.3 8.5  MG  --  1.9  --   --   PHOS  --   --  3.5  --    Liver Function Tests:  Recent Labs Lab 04/13/2014 1105 04/01/2014 1744 03/28/14 0500  AST 28 34 31  ALT 17 16 15   ALKPHOS 124* 135* 128*  BILITOT 0.7 0.8 0.7  PROT 6.4 5.9* 5.2*  ALBUMIN 3.1* 2.9* 2.3*    Recent Labs Lab 04/06/2014 1105  LIPASE 480*   No results found for this basename: AMMONIA,  in the last 168 hours CBC:  Recent Labs Lab 04/11/2014 1105 04/24/2014 1744 03/28/14 0500  WBC 10.4 12.2* 8.5  NEUTROABS 8.5* 9.8*  --   HGB 12.9 13.4 11.8*  HCT 38.5 39.8 36.6   MCV 87.3 87.9 89.1  PLT 191 186 171   Cardiac Enzymes: No results found for this basename: CKTOTAL, CKMB, CKMBINDEX, TROPONINI,  in the last 168 hours BNP: No components found with this basename: POCBNP,  CBG:  Recent Labs Lab 03/28/14 0625  GLUCAP 66*    No results found for this or any previous visit (from the past 240 hour(s)).   Studies: Ct Abdomen Pelvis Wo Contrast 04/12/2014    IMPRESSION: Findings consistent with a cecal volvulus with areas concerning for pneumatosis in the nondependent wall of the bowel. Critical Value/emergent results were called by telephone at the time of interpretation on 04/13/2014 at 6:16 PM to Dr. Barkley Bruns, who verbally acknowledged these results.  A compensatory small bowel obstruction is appreciated.  Diffuse ascites  Bibasilar interstitial infiltrates and small effusions. Differential considerations include pulmonary edema, infectious interstitial infiltrates, lymphangitic neoplastic infiltration.  Findings consistent with diffuse metastatic disease within the liver until proven otherwise and concerning for metastatic pulmonary nodule right lung base.  Bilateral nephrolithiasis without hydronephrosis.     Ir Ceco/colonic Tube Insert Percut W/fluoro Mod Sed 03/28/2014   IMPRESSION: Successful fluoroscopic insertion of an 65 French balloon retention cecostomy tube.  The cecostomy tube will be placed to low wall suction overnight and then as directed by the ordering surgical service.   Electronically Signed   By: Sandi Mariscal M.D.   On: 03/28/2014 08:53    Scheduled Meds:  Continuous Infusions: . dextrose 5 % and 0.9% NaCl 100 mL/hr at 03/29/14 0507

## 2014-03-30 DIAGNOSIS — K562 Volvulus: Principal | ICD-10-CM

## 2014-03-30 MED ORDER — HYDROMORPHONE HCL PF 1 MG/ML IJ SOLN
0.5000 mg | INTRAMUSCULAR | Status: DC | PRN
  Administered 2014-03-30 (×2): 0.75 mg via INTRAVENOUS
  Administered 2014-03-31 – 2014-04-01 (×7): 1 mg via INTRAVENOUS
  Administered 2014-04-01 (×2): 0.5 mg via INTRAVENOUS
  Administered 2014-04-01: 1 mg via INTRAVENOUS
  Administered 2014-04-01 – 2014-04-02 (×2): 0.5 mg via INTRAVENOUS
  Administered 2014-04-02: 1 mg via INTRAVENOUS
  Administered 2014-04-02 (×2): 0.5 mg via INTRAVENOUS
  Administered 2014-04-02: 1 mg via INTRAVENOUS
  Filled 2014-03-30 (×18): qty 1

## 2014-03-30 MED ORDER — OXYCODONE HCL 5 MG PO TABS: 5.0000 mg | ORAL_TABLET | ORAL | Status: DC | PRN

## 2014-03-30 NOTE — Progress Notes (Signed)
Subjective: She says she feels better, she is clearly less distended than Friday 5/1.  She is still distended and tender. She has fluid coming from the Cecostomy.  Her daughter is asking if she can have some full liquids she would like some spoonfuls of yogurt.  She is having pain, mouth is dry, speech  Is a little thick.  She is taking some pain medicine, but only when she ask and probably needs more.  Objective: Vital signs in last 24 hours: Temp:  [97.9 F (36.6 C)-98.7 F (37.1 C)] 97.9 F (36.6 C) (05/04 0500) Pulse Rate:  [87-113] 89 (05/04 0500) Resp:  [18] 18 (05/04 0500) BP: (124-144)/(68-78) 138/78 mmHg (05/04 0500) SpO2:  [92 %-99 %] 99 % (05/04 0500) Weight:  [54.9 kg (121 lb 0.5 oz)] 54.9 kg (121 lb 0.5 oz) (05/04 0500) Last BM Date: 03/26/14 630 PO, 500 from the drain. Afebrile, VSS No labs Intake/Output from previous day: 05/03 0701 - 05/04 0700 In: 2860.8 [P.O.:630; I.V.:2140.8] Out: 1250 [Urine:750; Drains:500] Intake/Output this shift: Total I/O In: 30 [Other:30] Out: -   General appearance: alert, cooperative, mild distress and she is still distended and tender, less so than 5/1. Resp: clear to auscultation bilaterally GI: Moderate distension, tube working, few BS.  tender all over doesn't want me to palpate her abodmen.    Lab Results:   Recent Labs  2014-04-10 1744 03/28/14 0500  WBC 12.2* 8.5  HGB 13.4 11.8*  HCT 39.8 36.6  PLT 186 171    BMET  Recent Labs  04/10/14 1744 03/28/14 0500  NA 133* 137  K 4.5 4.4  CL 95* 101  CO2 25 25  GLUCOSE 79 61*  BUN 23 21  CREATININE 0.68 0.67  CALCIUM 9.3 8.5   PT/INR  Recent Labs  04-10-2014 1744  LABPROT 14.3  INR 1.13     Recent Labs Lab 10-Apr-2014 1105 10-Apr-2014 1744 03/28/14 0500  AST 28 34 31  ALT 17 16 15   ALKPHOS 124* 135* 128*  BILITOT 0.7 0.8 0.7  PROT 6.4 5.9* 5.2*  ALBUMIN 3.1* 2.9* 2.3*     Lipase     Component Value Date/Time   LIPASE 480* 2014-04-10 1105      Studies/Results: No results found.  Medications: Current facility-administered medications:acetaminophen (TYLENOL) suppository 650 mg, 650 mg, Rectal, Q6H PRN, Robbie Lis, MD;  acetaminophen (TYLENOL) tablet 650 mg, 650 mg, Oral, Q6H PRN, Robbie Lis, MD;  dextrose 5 %-0.9 % sodium chloride infusion, , Intravenous, Continuous, Marcelle Smiling, MD, Last Rate: 75 mL/hr at 03/29/14 1957 HYDROmorphone (DILAUDID) injection 0.75 mg, 0.75 mg, Intravenous, Q2H PRN, Marcelle Smiling, MD, 0.75 mg at 03/30/14 0540;  ondansetron (ZOFRAN) injection 4 mg, 4 mg, Intravenous, Q6H PRN, Robbie Lis, MD    Assessment/Plan 1. Colonic obstruction, likely right colon cancer, with metastasis to lung and liver.  S/p fluoroscopic guided replacement of 18 Fr balloon retention cecostomy tube for decompression, Apr 10, 2014,  Sandi Mariscal, MD. 2. COPD, tobacco use for 34 years  3. Chronic lower extremity edema/ 2D echo 12/22/13 EF 55-60%; Stage 1 systolic dysfunction  4. Breast cancer with bilateral mastectomies and reconstructions   5. Goiter, nontoxic, multinodular  6. Urinary incontinence  7. Hx of colonic polyps  8. Osteoporosis  9. Breast cancer with bilateal mastectomies and reconstructions  10. Escherichia coli urinary tract infection 11.  PCM  12.  Chronic Diastolic CHF  Plan:  DNR, pain control,  I think she needs more.  i  will check on getting her something more flavorful than clears.  She isn't taking very much.     LOS: 3 days    Earnstine Regal 03/30/2014

## 2014-03-30 NOTE — Progress Notes (Signed)
[facility] PROGRESS NOTE  Ana Fisher WNU:[id number] DOB: February 17, 1923 DOA: 03/30/2014 PCP: [name], MD  Brief narrative: 80 year old female with past medical history of breast cancer, ostearthritis, chronic diastolic CHF (2 D ECHO in 11/2013 showed EF 55%) who presented to [facility] ED 04/16/2014 with worsening abdominal pain and distention for past few days prior to this admission with associated nausea and vomiting.  In ED, vitals are stable with BP of 141/72, HR 77, T max 98.6 F and oxygen saturation of 91 - 99% on 2 L [place] oxygen support. Abdominal X ray showed possible cecal volvulus. CT abdomen showed findings consistent with a cecal volvulus with areas concerning for pneumatosis in the nondependent wall of the bowel. Also seen is likely SBO, diffuse ascites, possible lymphangitic neoplastic infiltration, findings consistent with diffuse metastatic disease within the liver. Pt does not wish to have any surgery.   Assessment and Plan:   Active Problems:  Cecal volvulus  - appreciate surgery recommendations  - continue supportive care - pt has cecostomy tube which was placed 04/16/2014 by IR  - still on clear liquid diet but not eating much - continue antiemetics as needed, analgesia as needed  - appreciate palliative care following  Hyponatremia  - mild, likely dehydration or lasix  - has resolved to normal with IV fluids  Chronic CHF, diastolic  - BNP elevated at 1262 but pt dehydrated clinically so lasix on hold while we are giving IV fluids  - 2 D ECHO in 11/2013 showed normal EF  Moderate protein calorie malnutrition  - secondary to malignancy  - clear liquids   Code Status: DNR/DNI Family Communication: family at the bedside  Disposition Plan: home when stable   Consultants:  Surgery  IR  Palliative care Procedures:  Cecostomy tube placed 04/25/2014  Antibiotics:  None    Robbie Lis, MD  [facility] Pager [phone]  If 7PM-7AM, please contact  night-coverage [email or web address] Password [email or web address] 03/30/2014, 11:53 AM   LOS: 3 days    HPI/Subjective: Feels ok this am but tired.  Objective: Filed Vitals:   03/29/14 0534 03/29/14 1400 03/29/14 2146 03/30/14 0500  BP: 141/76 144/73 124/68 138/78  Pulse: 99 87 113 89  Temp: 98.1 F (36.7 C) 98.3 F (36.8 C) 98.7 F (37.1 C) 97.9 F (36.6 C)  TempSrc: Oral Oral Oral Oral  Resp: 18 18 18 18   Height:      Weight: 54.4 kg (119 lb 14.9 oz)   54.9 kg (121 lb 0.5 oz)  SpO2: 95% 92% 94% 99%    Intake/Output Summary (Last 24 hours) at 03/30/14 1153 Last data filed at 03/30/14 0815  Gross per 24 hour  Intake 2790.83 ml  Output   1100 ml  Net 1690.83 ml    Exam:   General:  Pt is alert, follows commands appropriately, not in acute distress  Cardiovascular: Regular rate and rhythm, S1/S2 appreciated   Respiratory: Clear to auscultation bilaterally, no wheezing, no crackles, no rhonchi  Abdomen: distended, cecostomy in place   Extremities: +1 LE pitting edema, pulses DP and PT palpable bilaterally  Neuro: Grossly nonfocal  Data Reviewed: Basic Metabolic Panel:  Recent Labs Lab 04/17/2014 1105 04/23/2014 1411 04/10/2014 1744 03/28/14 0500  NA 132*  --  133* 137  K 4.1  --  4.5 4.4  CL 93*  --  95* 101  CO2 25  --  25 25  GLUCOSE 89  --  79 61*  BUN 24*  --  23 21  CREATININE 0.66  --  0.68 0.67  CALCIUM 9.3  --  9.3 8.5  MG  --  1.9  --   --   PHOS  --   --  3.5  --    Liver Function Tests:  Recent Labs Lab 03/31/2014 1105 04/24/2014 1744 03/28/14 0500  AST 28 34 31  ALT 17 16 15   ALKPHOS 124* 135* 128*  BILITOT 0.7 0.8 0.7  PROT 6.4 5.9* 5.2*  ALBUMIN 3.1* 2.9* 2.3*    Recent Labs Lab 04/04/2014 1105  LIPASE 480*   No results found for this basename: AMMONIA,  in the last 168 hours CBC:  Recent Labs Lab 04/19/2014 1105 04/08/2014 1744 03/28/14 0500  WBC 10.4 12.2* 8.5  NEUTROABS 8.5* 9.8*  --   HGB 12.9 13.4 11.8*  HCT 38.5 39.8 36.6  MCV 87.3 87.9  89.1  PLT 191 186 171   Cardiac Enzymes: No results found for this basename: CKTOTAL, CKMB, CKMBINDEX, TROPONINI,  in the last 168 hours BNP: No components found with this basename: POCBNP,  CBG:  Recent Labs Lab 03/28/14 0625  GLUCAP 66*    No results found for this or any previous visit (from the past 240 hour(s)).   Studies: No results found.  Scheduled Meds:  Continuous Infusions: . dextrose 5 % and 0.9% NaCl 75 mL/hr at 03/30/14 1100

## 2014-03-30 NOTE — Progress Notes (Signed)
  Subjective: Pt doing ok today; tolerating  liquid diet  Objective: Vital signs in last 24 hours: Temp:  [97.9 F (36.6 C)-98.7 F (37.1 C)] 97.9 F (36.6 C) (05/04 0500) Pulse Rate:  [87-113] 89 (05/04 0500) Resp:  [18] 18 (05/04 0500) BP: (124-144)/(68-78) 138/78 mmHg (05/04 0500) SpO2:  [92 %-99 %] 99 % (05/04 0500) Weight:  [121 lb 0.5 oz (54.9 kg)] 121 lb 0.5 oz (54.9 kg) (05/04 0500) Last BM Date: 03/26/14  Intake/Output from previous day: 05/03 0701 - 05/04 0700 In: 2860.8 [P.O.:630; I.V.:2140.8] Out: 1250 [Urine:750; Drains:500] Intake/Output this shift: Total I/O In: 30 [Other:30] Out: -   Cecostomy tube intact, good output; insertion site mildly tender; abd still dist but not quite as prior to cecostomy  Lab Results:   Recent Labs  04/09/14 1744 03/28/14 0500  WBC 12.2* 8.5  HGB 13.4 11.8*  HCT 39.8 36.6  PLT 186 171   BMET  Recent Labs  04/09/14 1744 03/28/14 0500  NA 133* 137  K 4.5 4.4  CL 95* 101  CO2 25 25  GLUCOSE 79 61*  BUN 23 21  CREATININE 0.68 0.67  CALCIUM 9.3 8.5   PT/INR  Recent Labs  09-Apr-2014 1744  LABPROT 14.3  INR 1.13   ABG No results found for this basename: PHART, PCO2, PO2, HCO3,  in the last 72 hours  Studies/Results: No results found.  Anti-infectives: Anti-infectives   Start     Dose/Rate Route Frequency Ordered Stop   04/09/14 2145  ciprofloxacin (CIPRO) IVPB 400 mg  Status:  Discontinued     400 mg 200 mL/hr over 60 Minutes Intravenous  Once 09-Apr-2014 2214 2014-04-09 2216   04-09-14 2137  ciprofloxacin (CIPRO) 400 MG/200ML IVPB    Comments:  Alfred Levins   : cabinet override      2014-04-09 2137 2014-04-09 2145      Assessment/Plan: s/p decompressive/palliative cecostomy 5/1; plans as per primary/CCS/palliative care; should remove T tack sutures in 2 weeks  LOS: 3 days    D Rowe Robert 03/30/2014

## 2014-03-30 NOTE — Progress Notes (Signed)
Clinical Social Work  CSW met with patient's dtr face-to-face. CSW and dtr spoke about financial barriers to placement at [facility]. Dtr reports she is unsure about [facility] and cannot afford daily rates. CSW spoke about hospice placement and dtr reports she was unsure of process. CSW offered choice and explained hospice setting. Dtr reports that hospice sounds like an appropriate fit and expressed desire in [facility]. CSW made referral to [facility] representative Harmon Pier) who will review patient.  CSW will continue to follow.  Between, [place] [phone]

## 2014-03-30 NOTE — Progress Notes (Signed)
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  [place] surgery  OK to try pureed PO as tolerated Palliate pain - try fentanyl instead.  ?consider PCA for better pain control if staying a while Could try PO oxycodone if tolerating PO Dispo - discussing Hospice - per family.

## 2014-03-30 NOTE — Progress Notes (Signed)
Clinical Social Work Department BRIEF PSYCHOSOCIAL ASSESSMENT 03/30/2014  Patient:  Ana Fisher, Ana Fisher     Account Number:  [id number]     Admit date:  04/23/2014  Clinical Social Worker:  Earlie Server  Date/Time:  03/30/2014 11:30 AM  Referred by:  Physician  Date Referred:  03/30/2014 Referred for  Psychosocial assessment   Other Referral:   Interview type:  Patient Other interview type:    PSYCHOSOCIAL DATA Living Status:  ALONE Admitted from facility:   Level of care:   Primary support name:  Ana Fisher Primary support relationship to patient:  CHILD, ADULT Degree of support available:   Strong    CURRENT CONCERNS Current Concerns  Post-Acute Placement   Other Concerns:    SOCIAL WORK ASSESSMENT / PLAN CSW spoke with PMT MD who reports discussions have been held with patient and family re: DC to [facility] or Hospice Home. PMT MD believes that patient qualifies for hospice home if patient is agreeable.    CSW met with patient at bedside and introduced myself. 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 CSW spoke with Ana Fisher who reports that they are out of network with patient's insurance and that it would not be approved anywhere if she wants comfort care. [facility] can accept patient in LT care for $293/day. CSW explained this to patient who reports she wants to talk with dtr about plans. CSW wrote information and left copy for patient in room. CSW also called dtr and left a message with contact information for return call.    CSW will continue to follow in order to determine if patient wants to pay privately at [facility] or if she is interested in hospice home.   Assessment/plan status:  Psychosocial Support/Ongoing Assessment of Needs Other assessment/ plan:   Information/referral to community resources:   SNF vs Hospice Home    PATIENT'S/FAMILY'S RESPONSE TO PLAN OF CARE: Patient alert and oriented and engaged in  assessment. Patient reports that she wanted to go to [facility] because she feels comfortable there and knows all of the people. Patient does not feel comfortable making any decisions without her dtr's opinion as well. Patient agreeable for CSW to contact dtr and to follow up with their decision.       [place], [place] [phone]

## 2014-03-31 ENCOUNTER — Telehealth: Payer: Self-pay | Admitting: *Deleted

## 2014-03-31 ENCOUNTER — Inpatient Hospital Stay (HOSPITAL_COMMUNITY): Payer: Medicare HMO

## 2014-03-31 DIAGNOSIS — R0989 Other specified symptoms and signs involving the circulatory and respiratory systems: Secondary | ICD-10-CM

## 2014-03-31 DIAGNOSIS — K56609 Unspecified intestinal obstruction, unspecified as to partial versus complete obstruction: Secondary | ICD-10-CM

## 2014-03-31 DIAGNOSIS — R0609 Other forms of dyspnea: Secondary | ICD-10-CM

## 2014-03-31 DIAGNOSIS — Z515 Encounter for palliative care: Secondary | ICD-10-CM

## 2014-03-31 DIAGNOSIS — Z853 Personal history of malignant neoplasm of breast: Secondary | ICD-10-CM

## 2014-03-31 MED ORDER — ATROPINE SULFATE 1 % OP SOLN
4.0000 [drp] | OPHTHALMIC | Status: DC | PRN
  Filled 2014-03-31: qty 2

## 2014-03-31 MED ORDER — ATROPINE SULFATE 1 % OP SOLN: 4.0000 [drp] | OPHTHALMIC | Status: AC | PRN

## 2014-03-31 MED ORDER — OXYCODONE HCL 5 MG PO TABS: 5.0000 mg | ORAL_TABLET | ORAL | Status: AC | PRN

## 2014-03-31 MED ORDER — ACETAMINOPHEN 325 MG PO TABS: 650.0000 mg | ORAL_TABLET | Freq: Four times a day (QID) | ORAL | Status: AC | PRN

## 2014-03-31 NOTE — Consult Note (Signed)
Ana Fisher: Received request from [place] for family interest in [facility]. Chart reviewed. Appreciate info from Ana Fisher. Met with patient early this morning. She was alert and explained to why she had chosen [facility]. She requested I follow up with dtrs Ana Fisher and Ana Fisher which I have done. Met with daughter Ana Fisher and son Ana Fisher to complete registration paper work. Understand patient is looking forward to son Ana Fisher arriving this afternoon. Await call back from Dr. Linna Darner re Hospice attending coverage. Will follow up in am. CSW [place] updated. Will follow up in am. Thank you. Erling Conte, [place]

## 2014-03-31 NOTE — Progress Notes (Signed)
Patient AF:HSVEXOG A Tri      DOB: 02-12-1923      ACG:[id number]  Patient sleeping.  Nursing reports current dilaudid effective only needed one dose.  Met with daughter Rick Duff, she is ok with considering hospice facility and has been coordinating family visits as she now knows that their time with their mom will likely be short. Will update [employer] in the am.    Carmichael Burdette L. Lovena Le, MD MBA The Palliative Medicine Team at [facility] Phone: [phone] Pager: [phone]

## 2014-03-31 NOTE — Progress Notes (Signed)
Patient Ana Fisher      DOB: May 13, 1923      UTM:[id number]   Palliative Medicine Team at [facility] Progress Note    Subjective:  Ana Fisher is worse today.  Noting some rales.  She is trying to be in control and is therfore refusing pain meds.  We await the arrive of her sons.  I think that once they all are her she will rest.  I would have low tolerance for starting continuous opiates once all the family arrives because she is so strong willed she won't ask for medication. Updated son and daughter at the bedside.  [facility] bed available in the am if she survives     Filed Vitals:   03/31/14 0547  BP: 149/66  Pulse: 99  Temp: 98.8 F (37.1 C)  Resp: 18   Physical exam:  General : awake , fussing about turning, giving orders PERRL, EOMI, mm dry Chest decreased with new rales CVS: tachy intermittently, S1, S2 ABd: distended, firm, very tender today, fecalant material in drain Ext: warm, not mottling,trace non pitting edema Neuro: oriented to herself and her family, giving orders- "now we are going to turn just this much then rest and turn some more....."  Held off on picc due to change in status, prognosis likely hours to day Assessment and plan: 80 yr old white female admitted with abdomen pain found to have obstructing mass with volvulus.  Cecostomy tube was placed with some relief.  Today she has declined some.  Hospice placement arranged but I am not sure she will survive to move.  We are trying to work with her wishes to be awake but she is having increased respiratory symptoms today and abdomen pain.  I rechecked her after an earlier visit this evening and she is resting much more comfortably.  1.  DNR  2.  Increased pulmonary edema/pleural effusion causing tachypnea: KVO fluids, leave atropine and hold on scopolamine for now very dry mm.  3.  Abd Pain:  Prn dilaudid.  Dose very helpful when she will accept it.   4.  Nausea: prn meds   Prognosis hours to  days  Holding PICC for now.   Alethea Terhaar L. Lovena Le, MD MBA The Palliative Medicine Team at [facility] Phone: [phone] Pager: [phone]   Total time: 1230- 1250 and 600-630 pm

## 2014-03-31 NOTE — Discharge Summary (Addendum)
Physician Discharge Summary  Ana Fisher NTI:[id number] DOB: 05/28/1923 DOA: 04/07/2014  PCP: [name], MD  Admit date: 04/07/2014 Discharge date: 04/01/2014  Recommendations for Outpatient Follow-up:  1. Continue current irrigation of cecostomy.   Discharge Diagnoses:  Principal Problem:   Colonic obstruction  Active Problems:   Diastolic CHF, chronic   Ascites   Liver metastases   Cecal volvulus    Discharge Condition: medically stable for transfer to residential hospice  Diet recommendation: clear liquids   History of present illness:  80 year old female with past medical history of breast cancer, ostearthritis, chronic diastolic CHF (2 D ECHO in 11/2013 showed EF 55%) who presented to [facility] ED 03/30/2014 with worsening abdominal pain and distention for past few days prior to this admission with associated nausea and vomiting.  In ED, vitals are stable with BP of 141/72, HR 77, T max 98.6 F and oxygen saturation of 91 - 99% on 2 L [place] oxygen support. Abdominal X ray showed possible cecal volvulus. CT abdomen showed findings consistent with a cecal volvulus with areas concerning for pneumatosis in the nondependent wall of the bowel. Also seen is likely SBO, diffuse ascites, possible lymphangitic neoplastic infiltration, findings consistent with diffuse metastatic disease within the liver. Pt does not wish to have any surgery. In addition, patient and her family declined to have PICC inserted.  Assessment and Plan:   Active Problems:  Cecal volvulus  - status post cecostomy percutaneous tube placement by IR 04/11/2014; she had less distention based on recent abdominal films and per IR continue current irrigation - on clear liquids; able to tolerate some solid food such as pudding  - plan is to transfer to residential hospice in am Hyponatremia  - mild, likely dehydration or lasix  - has resolved to normal with IV fluids  Chronic CHF, diastolic  - BNP elevated at 1262 but pt  dehydrated clinically so lasix was put on hold while we were giving IV fluids. Since focus is on comfort we have stopped all PO meds except for pain meds per family wishes. - 2 D ECHO in 11/2013 showed normal EF  Severe protein calorie malnutrition  - secondary to malignancy  - clear liquids or ensure pudding.  Code Status: DNR/DNI  Family Communication: family at the bedside   Consultants:  Surgery  IR  Palliative care Procedures:  Cecostomy tube placed 04/05/2014  Antibiotics:  None   Signed:  Robbie Lis, MD  [facility] 03/31/2014, 6:24 PM  Pager #: [phone]   Discharge Exam: Filed Vitals:   03/31/14 0547  BP: 149/66  Pulse: 99  Temp: 98.8 F (37.1 C)  Resp: 18   Filed Vitals:   03/30/14 0500 03/30/14 1421 03/30/14 2206 03/31/14 0547  BP: 138/78 144/69 155/87 149/66  Pulse: 89 100 109 99  Temp: 97.9 F (36.6 C) 99.1 F (37.3 C) 98.4 F (36.9 C) 98.8 F (37.1 C)  TempSrc: Oral Oral Oral Oral  Resp: 18 20 18 18   Height:      Weight: 54.9 kg (121 lb 0.5 oz)     SpO2: 99% 97% 94% 93%    General: Pt is alert, follows commands appropriately, not in acute distress Cardiovascular: Regular rate and rhythm, S1/S2 +, no murmurs, no rubs, no gallops Respiratory: Clear to auscultation bilaterally, no wheezing, no crackles, no rhonchi Abdominal: distended, bowel sounds +, no guarding Extremities: LE +1 edema, no cyanosis, pulses palpable bilaterally DP and PT Neuro: Grossly nonfocal  Discharge Instructions  Discharge Orders   Future Appointments Provider Department Dept Phone   04/15/2014 3:30 PM Thayer Headings, MD [place] Office [phone]   Future Orders Complete By Expires   Call MD for:  difficulty breathing, headache or visual disturbances  As directed    Call MD for:  persistant dizziness or light-headedness  As directed    Call MD for:  persistant nausea and vomiting  As directed    Call MD for:  severe uncontrolled pain  As  directed    Diet - low sodium heart healthy  As directed    Discharge instructions  As directed    Increase activity slowly  As directed        Medication List    STOP taking these medications       CALTRATE 600 PO     CENTRUM SILVER PO     Fish Oil 1200 MG Caps     furosemide 20 MG tablet  Commonly known as:  LASIX     MYRBETRIQ 50 MG Tb24 tablet  Generic drug:  mirabegron ER     naproxen sodium 220 MG tablet  Commonly known as:  ANAPROX     traMADol 50 MG tablet  Commonly known as:  ULTRAM      TAKE these medications       acetaminophen 325 MG tablet  Commonly known as:  TYLENOL  Take 2 tablets (650 mg total) by mouth every 6 (six) hours as needed for mild pain (or Fever >/= 101).     atropine 1 % ophthalmic solution  Place 4 drops under the tongue every 4 (four) hours as needed (terminal secretions).     oxyCODONE 5 MG immediate release tablet  Commonly known as:  Oxy IR/ROXICODONE  Take 1-2 tablets (5-10 mg total) by mouth every 4 (four) hours as needed for moderate pain, severe pain or breakthrough pain.           Follow-up Information   Follow up with [name], MD. (As needed)    Specialty:  Internal Medicine   Contact information:   [address] [address] [phone]        The results of significant diagnostics from this hospitalization (including imaging, microbiology, ancillary and laboratory) are listed below for reference.    Significant Diagnostic Studies: Ct Abdomen Pelvis Wo Contrast  04/18/2014   CLINICAL DATA:  evaluate for cecal volvulus  EXAM: CT ABDOMEN AND PELVIS WITHOUT CONTRAST  TECHNIQUE: Multidetector CT imaging of the abdomen and pelvis was performed following the standard protocol without intravenous contrast.  COMPARISON:  DG ABD ACUTE W/CHEST dated 04/19/2014  FINDINGS: Small bilateral effusions are appreciated within the lung bases. There is diffuse thickening of the interstitial markings with areas of  tree-in-bud configuration. Areas of increased density are in the dependent portions of the lung bases. A 1.5 cm nodule is appreciated within the right lung base concerning for metastatic disease considering the patient's history.  Diffuse ascites is appreciated within the abdomen. There is diffuse dilation of the cecum consistent with the patient's history of cecal volvulus with maximum diameters of 11 cm. There are areas concerning for pneumatosis within the bowel wall in the nondependent posterior portion of the cecum images 41 through 51. The transition point appears to be within the pelvis on image 39 coronal series and images 55-57 series 2. Multiple compensatory dilated loops of small bowel are appreciated within the lower abdomen and pelvis containing fluid and air.  The liver is atrophic and contains multiple low attenuating masses primarily in the right lobe. There is diffuse periportal edema.  Evaluation of the right kidney demonstrates a nonobstructing 6 mm calculus medullary portion of the lower pole. There are multiple multiple calculi along the course of the mid ureter on the right. A nonobstructing medullary calculus identified within the left kidney. There is no evidence of hydronephrosis on the right or left.  The spleen, pancreas, adrenals are unremarkable.  Atherosclerotic calcifications are identified within aorta. There is no evidence of an abdominal aortic aneurysm.  Multilevel spondylosis is appreciated within the spine. There does not appear to be aggressive appearing osseous lesions. Compression deformities are appreciated within the lower lumbar spine.  IMPRESSION: Findings consistent with a cecal volvulus with areas concerning for pneumatosis in the nondependent wall of the bowel. Critical Value/emergent results were called by telephone at the time of interpretation on 04/24/2014 at 6:16 PM to Dr. Barkley Bruns, who verbally acknowledged these results.  A compensatory small bowel obstruction is  appreciated.  Diffuse ascites  Bibasilar interstitial infiltrates and small effusions. Differential considerations include pulmonary edema, infectious interstitial infiltrates, lymphangitic neoplastic infiltration.  Findings consistent with diffuse metastatic disease within the liver until proven otherwise and concerning for metastatic pulmonary nodule right lung base.  Bilateral nephrolithiasis without hydronephrosis.   Electronically Signed   By: Margaree Mackintosh M.D.   On: 04/18/2014 18:19   Dg Chest 2 View  03/18/2014   CLINICAL DATA:  Pain.  EXAM: CHEST  2 VIEW  COMPARISON:  DG CHEST 2 VIEW dated 02/03/2014  FINDINGS: Mediastinum and hilar structures are normal. Cardiomegaly with normal pulmonary vascularity. Small bilateral pleural effusions. These are new. No focal pulmonary infiltrate. Bilateral breast implants. Diffuse thoracic spine osteopenia and degenerative change.  IMPRESSION: 1. Small bilateral pleural effusions. 2. Mild cardiomegaly, no pulmonary venous distention. 3. Bilateral breast implants.   Electronically Signed   By: Marcello Moores  Register   On: 03/18/2014 14:15   Dg Thoracic Spine 2 View  03/18/2014   CLINICAL DATA:  Pain  EXAM: THORACIC SPINE - 2 VIEW  COMPARISON:  None.  FINDINGS: Frontal and lateral views were obtained. There is thoracolumbar levoscoliosis. There is no fracture or spondylolisthesis. There is disc narrowing at multiple levels. No erosive change.  IMPRESSION: Moderate osteoarthritic change. Mild scoliosis. No fracture or spondylolisthesis.   Electronically Signed   By: Lowella Grip M.D.   On: 03/18/2014 15:20   Ir Ceco/colonic Tube Insert Percut W/fluoro Mod Sed  03/28/2014   INDICATION: Recent diagnosis of suspected obstructing ascending colonic mass with multiple hepatic and pulmonary lesions worrisome for metastatic disease. Additionally, the cecum is markedly dilated with findings of pneumatosis. The patient is a poor endoscopy and surgical candidate and as such, the  request has been made for placement of a decompressive cecostomy tube for palliation of symptoms.  EXAM: FLUOROSCOPIC GUIDED PUSH CECOSTOMY TUBE PLACEMENT  COMPARISON:  CT ABD/PELV WO CM dated 04/25/2014  MEDICATIONS: Ciprofloxacin 400 mg IV  Antibiotics were administered within 1 hour of the procedure.  CONTRAST:  Approximately 20 mL of Isovue 300 administered into the cecum  ANESTHESIA/SEDATION: Versed 1 mg IV; Fentanyl 50 mcg IV.  Sedation time  15 minutes  FLUOROSCOPY TIME:  1 minute, 24 seconds.  COMPLICATIONS: None immediate  PROCEDURE: Informed written consent was obtained from the patient and the patient's family following explanation of the procedure, risks, benefits and alternatives. A time out was performed prior to the initiation of the procedure. Maximal  barrier sterile technique utilized including caps, mask, sterile gowns, sterile gloves, large sterile drape, hand hygiene and Betadine prep.  The right lower abdominal quadrant was sterilely prepped and draped. A preprocedural fluoroscopic image was obtained demonstrating persistent marked gas distention of the cecum within the right lower abdominal quadrant. Under sterile conditions and local anesthesia, 3 T tacks were utilized to pexy the anterior lateral aspect of the cecum against the ventral abdominal wall. Contrast injection confirmed appropriate positioning of each of the T tacks. An incision was made between the T tacks and a 17 gauge trocar needle was utilized to access the cecum. Needle position was confirmed within the cecum with aspiration of air and injection of a small amount of contrast. A stiff Amplatz was advanced into the cecum and under intermittent fluoroscopic guidance a telescoping peel-away sheath was placed ultimately allowing placement of an 18-French balloon retention cecostomy tube. The retention balloon was insufflated with a mixture of dilute saline and contrast and pulled taut against the anterior lateral wall of the cecum.  The external disc was cinched. Contrast injection confirms positioning within the cecum. A postprocedural spot fluoroscopic image was obtained. The patient tolerated procedure well without immediate post procedural complication.  FINDINGS: After successful fluoroscopic guided placement, the cecostomy tube is appropriately positioned with internal retention balloon against the ventral aspect of the cecum.  IMPRESSION: Successful fluoroscopic insertion of an 45 French balloon retention cecostomy tube.  The cecostomy tube will be placed to low wall suction overnight and then as directed by the ordering surgical service.   Electronically Signed   By: Sandi Mariscal M.D.   On: 03/28/2014 08:53   Dg Abd Acute W/chest  04/05/2014   CLINICAL DATA:  Lower abdominal pain, abdominal distention, history chronic constipation, breast cancer, smoking  EXAM: ACUTE ABDOMEN SERIES (ABDOMEN 2 VIEW & CHEST 1 VIEW)  COMPARISON:  Chest radiographs 03/18/2014, no prior abdominal radiographs for comparison  FINDINGS: Normal heart size, mediastinal contours, and pulmonary vascularity.  Density in RIGHT mid lung is unchanged question related to breast prosthesis, unchanged.  Underlying emphysematous changes.  Atherosclerotic calcification aorta.  No definite infiltrate, pleural effusion or pneumothorax.  Diffuse osseous demineralization.  Gaseous distention cecum, 12.4 cm diameter.  Minimal stool in proximal descending colon and rectum with out additional colonic gas.  Air-filled loops of upper normal caliber small bowel throughout abdomen.  No definite evidence of bowel obstruction, bowel wall thickening or free air.  No urinary tract calcification.  IMPRESSION: COPD changes without acute infiltrate.  Marked gaseous distention of the cecum 12.4 cm diameter with paucity of distal colonic gas highly suspicious for cecal volvulus.  Findings called to Dr. Alvino Chapel on 04/12/2014 at 1211 hr.   Electronically Signed   By: Lavonia Dana M.D.   On:  04/21/2014 12:12   Dg Abd Portable 2v  03/31/2014   CLINICAL DATA:  Post recent cecostomy.  Abdominal distention.  EXAM: PORTABLE ABDOMEN - 2 VIEW  COMPARISON:  04/14/2014  FINDINGS: Catheter projects over the right lower quadrant compatible with cecostomy, new since prior study. Decreasing gaseous distension of the colon, particularly the cecum which currently measures approximately 9 cm in diameter relative to 12.4 cm previously. No free air. No organomegaly.  IMPRESSION: Decreasing gaseous distention of the cecum following cecostomy.   Electronically Signed   By: Rolm Baptise M.D.   On: 03/31/2014 10:26    Microbiology: No results found for this or any previous visit (from the past 240 hour(s)).   Labs:  Basic Metabolic Panel:  Recent Labs Lab 11-Apr-2014 1105 04-11-2014 1411 Apr 11, 2014 1744 03/28/14 0500  NA 132*  --  133* 137  K 4.1  --  4.5 4.4  CL 93*  --  95* 101  CO2 25  --  25 25  GLUCOSE 89  --  79 61*  BUN 24*  --  23 21  CREATININE 0.66  --  0.68 0.67  CALCIUM 9.3  --  9.3 8.5  MG  --  1.9  --   --   PHOS  --   --  3.5  --    Liver Function Tests:  Recent Labs Lab 04/11/2014 1105 04-11-14 1744 03/28/14 0500  AST 28 34 31  ALT 17 16 15   ALKPHOS 124* 135* 128*  BILITOT 0.7 0.8 0.7  PROT 6.4 5.9* 5.2*  ALBUMIN 3.1* 2.9* 2.3*    Recent Labs Lab 11-Apr-2014 1105  LIPASE 480*   No results found for this basename: AMMONIA,  in the last 168 hours CBC:  Recent Labs Lab 04-11-2014 1105 2014/04/11 1744 03/28/14 0500  WBC 10.4 12.2* 8.5  NEUTROABS 8.5* 9.8*  --   HGB 12.9 13.4 11.8*  HCT 38.5 39.8 36.6  MCV 87.3 87.9 89.1  PLT 191 186 171   Cardiac Enzymes: No results found for this basename: CKTOTAL, CKMB, CKMBINDEX, TROPONINI,  in the last 168 hours BNP: BNP (last 3 results)  Recent Labs  12/03/13 1020 03/18/14 1211 Apr 11, 2014 1105  PROBNP 145.0* 159.0* 1262.0*   CBG:  Recent Labs Lab 03/28/14 0625  GLUCAP 66*    Time coordinating discharge: Over 30  minutes

## 2014-03-31 NOTE — Discharge Instructions (Addendum)
Hospice Care  °Hospice is a care service which can be used by people who are terminally ill and in whom healing is no longer thought possible. Hospice care is for people believed to have no more than 6 months to live. It is meant to help with the two largest fears near the end of life (the fears of dying and of being alone), as well as pain management, and an attempt to allow people to pass away comfortably at home.  °Hospice staff: °· Administer appropriate pain relief. °· Provide nursing care. °· Offer reassurance and support to loved ones and family members. °· Provide services to keep people comfortable at home or in a hospice facility. °Together, you can see to it that your loved one is not alone during this last and important phase of life. °You, your family, and your caregivers help you decide when hospice services should begin. If your condition improves or the disease goes into remission, you can be discharged from the hospice program. You can return to hospice care at a later time if needed. °The hospice philosophy recognizes death as the final stage of life. It helps patients continue an alert, pain-free life, and manage symptoms while surrounded by their loved ones. Hospice affirms life without hurrying death. Hospice care treats the person rather than the disease. It emphasizes quality of life with family-centered care. Hospice care involves the patient and family and helps them make decisions.  °The care is designed to: °· Relieve or decrease pain. °· Control other problems. °· Provide as much quality time as possible. °· Allow people to die with dignity. °Unlike other medical care, the focus is no longer on curing disease. The goal of hospice care is to offer as high a quality of life as possible during the end of life. In this way, the last days of life may be spent with dignity.  °With hospice care, instead of spending the last weeks or months in a hospital, a person is with loved ones in the home  or a homelike setting. About 90 percent of hospice care is provided at home. But hospice is available wherever a person lives, including a nursing home or assisted-living residence. Some residential hospices designed specifically for hospice care also exist. Hospice care is available for many types of terminal illnesses. °Hospice services are meant to serve both the patient and family members. °· Comfort. In most cases, the individual stays in his or her home or in homelike surroundings instead of in a hospital. The core of hospice is a cooperative effort by family, friends and a team of professional and volunteer caregivers working together to meet your loved one's needs. This team supplies all necessary medicines and equipment. It works with both the person involved and family members to relieve pain and symptoms. °· Support. Individuals enjoy the support of loved ones by receiving much of the basic care from family and friends. A nurse may lead the team and coordinates the day-to-day care. A doctor is also part of the team. Chaplains and social workers are available to counsel the family and their loved one. They make sure emotional, spiritual, and social needs are being met. Trained volunteers perform a wide variety of tasks as needed, such as: °· Providing companionship. °· Doing light housekeeping. °· Preparing meals. °· Running errands. °· Providing respite for the family. °· Improving quality of life. Caring for someone who is dying is emotionally and physically demanding. This can be particularly true for family members   who are primary caregivers. But you can take comfort in knowing that hospice is an act of love that can improve the quality of life for all involved. Professionals are often available to tend to the needs of grieving family members as well.  Spiritual Care. Hospice care emphasizes the spiritual needs of you and your family. People differ in their spiritual needs and religious beliefs so  spiritual care is individualized to meet the persons' and family's needs. It may include helping you to look at what death means to you, to say good-bye, or to perform a specific religious ceremony or ritual. HOW TO SELECT A PROGRAM Most hospice programs are run by nonprofit, independent organizations. Some are affiliated with hospitals, nursing homes or home health care agencies. Some are for-profit organizations. You can learn about existing hospice programs in your area from your health caregivers. ASK THE FOLLOWING:  What services are available to the patient?  What services are offered to the family?  Are bereavement services available?  How involved are the family members?  How involved is the doctor?  Who makes up the hospice care team? How are they trained or screened?  How will the individual's pain and symptoms be managed?  If circumstances change, can services be provided in different settings, such as the home or the hospital?  Is the program reviewed and licensed by the state or certified in some other way?  Are all costs covered by insurance? How much you pay for hospice care can vary greatly. It depends on the length and type of care necessary and your insurance coverage. Medicare and most private insurance plans, including managed care organizations, cover hospice care. Hospice is also covered by Medicaid in most states. Some hospice programs provide services on a sliding fee scale, based on your ability to pay. They may also provide some durable medical equipment for support within the home. Document Released: 03/01/2004 Document Revised: 02/05/2012 Document Reviewed: 11/13/2005 [facility] Patient Information 2014 [place], [place].

## 2014-03-31 NOTE — Progress Notes (Signed)
  Subjective: She continues to be distended, Perhaps more than yesterday, I cannot be sure.  She was sleeping and pain medicines seem to be working.  She took some ice when she woke up to help he dry mouth.  Objective: Vital signs in last 24 hours: Temp:  [98.4 F (36.9 C)-99.1 F (37.3 C)] 98.8 F (37.1 C) (05/05 0547) Pulse Rate:  [99-109] 99 (05/05 0547) Resp:  [18-20] 18 (05/05 0547) BP: (144-155)/(66-87) 149/66 mmHg (05/05 0547) SpO2:  [93 %-97 %] 93 % (05/05 0547) Last BM Date: 03/26/14 Nothing PO recorded yesterday Dysphagia 1 200 from drain Afebrile, VSS No labs  Intake/Output from previous day: 05/04 0701 - 05/05 0700 In: 1845 [I.V.:1725] Out: 700 [Urine:500; Drains:200] Intake/Output this shift:    General appearance: she was sleeping when I came in.  she wakes easily and is parched, she took some ice, and seems to be fairly alert and understands what is going on. GI: distended, perhaps more than yesterday, she only put 200 out from cecostomy tube yesterday.   BS hypoactive.  Lab Results:  No results found for this basename: WBC, HGB, HCT, PLT,  in the last 72 hours  BMET No results found for this basename: NA, K, CL, CO2, GLUCOSE, BUN, CREATININE, CALCIUM,  in the last 72 hours PT/INR No results found for this basename: LABPROT, INR,  in the last 72 hours   Recent Labs Lab Apr 21, 2014 1105 2014/04/21 1744 03/28/14 0500  AST 28 34 31  ALT 17 16 15   ALKPHOS 124* 135* 128*  BILITOT 0.7 0.8 0.7  PROT 6.4 5.9* 5.2*  ALBUMIN 3.1* 2.9* 2.3*     Lipase     Component Value Date/Time   LIPASE 480* 2014/04/21 1105     Studies/Results: No results found.  Medications:    Assessment/Plan 1. Colonic obstruction,Cecal volvulus & pnematosis  likely right colon cancer, with metastasis to lung and liver.  Pt has declined surgery. S/p fluoroscopic guided replacement of 18 Fr balloon retention cecostomy tube  for decompression, 2014-04-21, Sandi Mariscal, MD.  2. COPD,  tobacco use for 34 years  3. Chronic lower extremity edema/ 2D echo 12/22/13 EF 55-60%; Stage 1 systolic dysfunction  4. Breast cancer with bilateral mastectomies and reconstructions   5. Goiter, nontoxic, multinodular  6. Urinary incontinence  7. Hx of colonic polyps  8. Osteoporosis  9. Breast cancer with bilateal mastectomies and reconstructions  10. Escherichia coli urinary tract infection  11. PCM  12. Chronic Diastolic CHF   Plan  I will ask IR to look at the drain and see if we can make it more effective.  .  LOS: 4 days    Ana Fisher 03/31/2014

## 2014-03-31 NOTE — Care Management Note (Unsigned)
    Page 1 of 1   03/31/2014     11:39:29 AM CARE MANAGEMENT NOTE 03/31/2014  Patient:  Ana Fisher, Ana Fisher   Account Number:  [id number]  Date Initiated:  03/31/2014  Documentation initiated by:  [facility]  Subjective/Objective Assessment:   80 year old female admitted with abdominal pain.     Action/Plan:   From home. Discharge to residential hospice facility once medically ready.   Anticipated DC Date:  04/22/2014   Anticipated DC Plan:  [place]  In-house referral  Clinical Social Worker      DC Planning Services  CM consult      Choice offered to / List presented to:             Status of service:  In process, will continue to follow Medicare Important Message given?   (If response is "NO", the following Medicare IM given date fields will be blank) Date Medicare IM given:   Date Additional Medicare IM given:    Discharge Disposition:    Per UR Regulation:  Reviewed for med. necessity/level of care/duration of stay  If discussed at [place] of Stay Meetings, dates discussed:    Comments:

## 2014-03-31 NOTE — Progress Notes (Signed)
Clinical Social Work  CSW continues to follow to assist with DC planning. [place] bed is available on 04/01/14 if patient is stable for transfer. CSW will follow up tomorrow and will assist with transfer if recommended by MD. MD aware and agreeable to plans.  [place], [place] [phone]

## 2014-03-31 NOTE — Telephone Encounter (Signed)
Harmon Pier called requesting whether Dr Linna Darner will be pts attending MD while at [facility].  Please advise

## 2014-03-31 NOTE — Progress Notes (Signed)
  Subjective: Pt feeling about the same; no acute changes  Objective: Vital signs in last 24 hours: Temp:  [98.4 F (36.9 C)-99.1 F (37.3 C)] 98.8 F (37.1 C) (05/05 0547) Pulse Rate:  [99-109] 99 (05/05 0547) Resp:  [18-20] 18 (05/05 0547) BP: (144-155)/(66-87) 149/66 mmHg (05/05 0547) SpO2:  [93 %-97 %] 93 % (05/05 0547) Last BM Date: 03/26/14  Intake/Output from previous day: 05/04 0701 - 05/05 0700 In: 1845 [I.V.:1725] Out: 700 [Urine:500; Drains:200] Intake/Output this shift:    Cecostomy tube intact, dressing dry, abd- mild-mod dist, mildly tender at tube insertion site, output 200 cc's brown liquid stool  Lab Results:  No results found for this basename: WBC, HGB, HCT, PLT,  in the last 72 hours BMET No results found for this basename: NA, K, CL, CO2, GLUCOSE, BUN, CREATININE, CALCIUM,  in the last 72 hours PT/INR No results found for this basename: LABPROT, INR,  in the last 72 hours ABG No results found for this basename: PHART, PCO2, PO2, HCO3,  in the last 72 hours  Studies/Results: Dg Abd Portable 2v  03/31/2014   CLINICAL DATA:  Post recent cecostomy.  Abdominal distention.  EXAM: PORTABLE ABDOMEN - 2 VIEW  COMPARISON:  04/22/2014  FINDINGS: Catheter projects over the right lower quadrant compatible with cecostomy, new since prior study. Decreasing gaseous distension of the colon, particularly the cecum which currently measures approximately 9 cm in diameter relative to 12.4 cm previously. No free air. No organomegaly.  IMPRESSION: Decreasing gaseous distention of the cecum following cecostomy.   Electronically Signed   By: Rolm Baptise M.D.   On: 03/31/2014 10:26    Anti-infectives: Anti-infectives   Start     Dose/Rate Route Frequency Ordered Stop   Apr 03, 2014 2145  ciprofloxacin (CIPRO) IVPB 400 mg  Status:  Discontinued     400 mg 200 mL/hr over 60 Minutes Intravenous  Once 04/10/2014 2214 03-Apr-2014 2216   04/05/2014 2137  ciprofloxacin (CIPRO) 400 MG/200ML  IVPB    Comments:  Alfred Levins   : cabinet override      04/20/2014 2137 03-Apr-2014 2145      Assessment/Plan: S/p decompressive/palliative  perc cecostomy 5/1; abd film today shows decreasing gaseous distension/no free air; cont with drain irrigation, monitor with prn abd film  LOS: 4 days    D Rowe Robert 03/31/2014

## 2014-03-31 NOTE — Telephone Encounter (Signed)
Spoke with Harmon Pier advised of MDs message

## 2014-03-31 NOTE — Progress Notes (Signed)
Pain better controlled.  In better spirits Flushing heping keep cecostomy tube working Pt mild/mod distended buit no peritonitis Family in room.  Others d/w Hospice/primary team Will remain peripheral given pt declining surgery

## 2014-03-31 NOTE — Telephone Encounter (Signed)
Please if that's OK with patient & family

## 2014-03-31 NOTE — Progress Notes (Signed)
Xray shows improvement with decreased distention of cecum after cecostomy.  Will add tube flushes.

## 2014-04-01 DIAGNOSIS — I509 Heart failure, unspecified: Secondary | ICD-10-CM

## 2014-04-01 DIAGNOSIS — I5032 Chronic diastolic (congestive) heart failure: Secondary | ICD-10-CM

## 2014-04-01 MED ORDER — LORAZEPAM 2 MG/ML IJ SOLN
0.5000 mg | INTRAMUSCULAR | Status: DC | PRN
  Administered 2014-04-02: 0.5 mg via INTRAVENOUS
  Filled 2014-04-01: qty 1

## 2014-04-01 NOTE — Progress Notes (Signed)
Pt family at bedside.  Pt family request that staff allow patient to continue resting.  Pt with periods of apnea and less responsive now than earlier per family.  Pt family refuse assessment and vitals at this time.  Will continue to monitor.

## 2014-04-01 NOTE — Progress Notes (Signed)
Patient with periods of apnea and becoming less responsive- she is remarkably intermittently alert. Her O2 sats are 75% her pressure has dropped. Anticipate death in next 24 hours. She is not stable for transport.  Lane Hacker, DO Palliative Medicine

## 2014-04-01 NOTE — Progress Notes (Signed)
[facility] PROGRESS NOTE  Ana Fisher UQJ:[id number] DOB: 1923-01-22 DOA: 2014/04/05 PCP: [name], MD  Assessment/Plan:  Cecal volvulus  - status post cecostomy percutaneous tube placement by IR 04-05-14; she had less distention based on recent abdominal films and per IR continue current irrigation  - on clear liquids; able to tolerate some solid food such as pudding  - patient and family have opted for comfort care only.  Hyponatremia  - mild, likely dehydration or lasix  - has resolved to normal with IV fluids   Chronic CHF, diastolic  - BNP elevated at 1262 but pt dehydrated clinically so lasix was put on hold while we were giving IV fluids. Since focus is on comfort we have stopped all PO meds except for pain meds per family wishes.  - 2 D ECHO in 11/2013 showed normal EF   Moderate protein calorie malnutrition  - secondary to malignancy  - clear liquids or ensure pudding.   Code Status: DNR Family Communication: Daughter and son at bedside updated on plan of care.  Disposition Plan: Likely hospital death. May consider [place] transfer in am is survival is anticipated to be more prolonged.   Consultants:  Palliative care   Antibiotics:  None   Subjective: In bed, can answer simple questions.  Objective: Filed Vitals:   03/30/14 1421 03/30/14 2206 03/31/14 0547 03/31/14 2054  BP: 144/69 155/87 149/66 94/67  Pulse: 100 109 99 155  Temp: 99.1 F (37.3 C) 98.4 F (36.9 C) 98.8 F (37.1 C) 97.7 F (36.5 C)  TempSrc: Oral Oral Oral Oral  Resp: 20 18 18 18   Height:      Weight:      SpO2: 97% 94% 93% 73%    Intake/Output Summary (Last 24 hours) at 04/01/14 1159 Last data filed at 03/31/14 1835  Gross per 24 hour  Intake 933.17 ml  Output    550 ml  Net 383.17 ml   Filed Weights   03/28/14 0609 03/29/14 0534 03/30/14 0500  Weight: 59 kg (130 lb 1.1 oz) 54.4 kg (119 lb 14.9 oz) 54.9 kg (121 lb 0.5 oz)     Exam:   General:  awake  Cardiovascular: tachy, regular  Respiratory: bilateral ronchi  Abdomen: S/NT/ND/+BS  Extremities: no C/C/E   Neurologic:  Non-focal  Data Reviewed: Basic Metabolic Panel:  Recent Labs Lab 04/05/14 1105 04/05/2014 1411 Apr 05, 2014 1744 03/28/14 0500  NA 132*  --  133* 137  K 4.1  --  4.5 4.4  CL 93*  --  95* 101  CO2 25  --  25 25  GLUCOSE 89  --  79 61*  BUN 24*  --  23 21  CREATININE 0.66  --  0.68 0.67  CALCIUM 9.3  --  9.3 8.5  MG  --  1.9  --   --   PHOS  --   --  3.5  --    Liver Function Tests:  Recent Labs Lab Apr 05, 2014 1105 04-05-2014 1744 03/28/14 0500  AST 28 34 31  ALT 17 16 15   ALKPHOS 124* 135* 128*  BILITOT 0.7 0.8 0.7  PROT 6.4 5.9* 5.2*  ALBUMIN 3.1* 2.9* 2.3*    Recent Labs Lab Apr 05, 2014 1105  LIPASE 480*   No results found for this basename: AMMONIA,  in the last 168 hours CBC:  Recent Labs Lab 04-05-2014 1105 04-05-14 1744 03/28/14 0500  WBC 10.4 12.2* 8.5  NEUTROABS 8.5* 9.8*  --   HGB 12.9  13.4 11.8*  HCT 38.5 39.8 36.6  MCV 87.3 87.9 89.1  PLT 191 186 171   Cardiac Enzymes: No results found for this basename: CKTOTAL, CKMB, CKMBINDEX, TROPONINI,  in the last 168 hours BNP (last 3 results)  Recent Labs  12/03/13 1020 03/18/14 1211 04/10/2014 1105  PROBNP 145.0* 159.0* 1262.0*   CBG:  Recent Labs Lab 03/28/14 0625  GLUCAP 66*    No results found for this or any previous visit (from the past 240 hour(s)).   Studies: Dg Abd Portable 2v  03/31/2014   CLINICAL DATA:  Post recent cecostomy.  Abdominal distention.  EXAM: PORTABLE ABDOMEN - 2 VIEW  COMPARISON:  04/04/2014  FINDINGS: Catheter projects over the right lower quadrant compatible with cecostomy, new since prior study. Decreasing gaseous distension of the colon, particularly the cecum which currently measures approximately 9 cm in diameter relative to 12.4 cm previously. No free air. No organomegaly.  IMPRESSION: Decreasing gaseous  distention of the cecum following cecostomy.   Electronically Signed   By: Rolm Baptise M.D.   On: 03/31/2014 10:26    Scheduled Meds:  Continuous Infusions: . dextrose 5 % and 0.9% NaCl 10 mL/hr at 03/31/14 1526    Principal Problem:   Colonic obstruction  Active Problems:   Diastolic CHF, chronic   Ascites   Liver metastases   Cecal volvulus    Time spent: 25 minutes. Greater than 50% of this time was spent in direct contact with the patient coordinating care.    [facility] Pager [phone]  If 7PM-7AM, please contact night-coverage at [email or web address], password [facility] 04/01/2014, 11:59 AM  LOS: 5 days

## 2014-04-02 MED ORDER — MORPHINE SULFATE 10 MG/ML IJ SOLN
0.5000 mg/h | INTRAMUSCULAR | Status: DC
  Administered 2014-04-02: 0.5 mg/h via INTRAVENOUS
  Filled 2014-04-02: qty 10

## 2014-04-04 NOTE — Discharge Summary (Signed)
Death summary  Patient was an 80 year old woman admitted to the hospital on 04/25/2014 with complaints of nausea, vomiting and abdominal pain. She had a history of breast cancer, osteoarthritis and chronic diastolic CHF. In the emergency department she had a CT scan of the abdomen and pelvis with findings consistent with a cecal volvulus. There was also diffuse metastatic disease within the liver and lung, unclear if this is metastatic breast cancer or new colon cancer. Findings were discussed with patient and her daughters who were acceptant of the diagnosis of metastatic cancer, decision was made to treat her with a cecostomy tube and as conservatively as possible. Repeat abdominal films showed a decrease in the gaseous distention of the colon following placement of the cecostomy tube.a palliative care consult was requested for goals of care.patient elected to transition to DO NOT RESUSCITATE and to be treated conservatively only with pain medications and the cecostomy tube. She subsequently worsened. She was initially to be transferred to a residential hospice home, however it was felt that her death was imminent and she was kept in the hospital. She subsequently expired on 04/15/14 at 1320.  Causes of death #1 acute respiratory failure #2 cancer of unclear origin with diffuse metastatic disease to the abdomen and lung #3 cecal volvulus #4 chronic diastolic CHF   Domingo Mend, MD [facility] Pager: [phone]

## 2014-04-15 ENCOUNTER — Ambulatory Visit: Payer: Self-pay | Admitting: Cardiovascular Disease

## 2014-04-27 NOTE — Progress Notes (Signed)
Nutrition Brief Note  Patient identified on [place] Report Chart reviewed. Pt now transitioning to comfort care.  No further nutrition interventions warranted at this time.  Please consult as needed.   Atlee Abide MS RD LDN Clinical Dietitian [phone]

## 2014-04-27 NOTE — Progress Notes (Addendum)
RN was called into room by family. Pt was found to have no pulse or respirations this was confirmed with Ana Lame RN at 1320. 100 cc of morphine was wasted from drip with Ana Lame RN.   [name]

## 2014-04-27 NOTE — Progress Notes (Signed)
Clinical Social Work  Patient was not stable for transfer to [facility]. CSW continues to follow in order to assist with DC planning and support during hospitalization.  [place], [place] [phone]

## 2014-04-27 NOTE — Progress Notes (Signed)
Palliative Care Team at [facility] Progress Note   SUBJECTIVE: Unresponsive. Periods of Apnea. Extremities Mottled.  Interval Events: Admitted 05/01 with newly diagnosed GI cancer.  OBJECTIVE: Vital Signs: BP 94/67  Pulse 155  Temp(Src) 97.7 F (36.5 C) (Oral)  Resp 18  Ht 5' 2.5" (1.588 m)  Wt 54.885 kg (121 lb)  BMI 21.76 kg/m2  SpO2 73%   Intake and Output: 05/06 0701 - 05/07 0700 In: 310 [I.V.:220] Out: -   Physical Exam: General: Vital signs reviewed and noted. Hypoxemia. Frail. Loss of jaw control. Actively dying.  Head: Normocephalic, atraumatic.  Lungs:  Agonal.  Heart: Tachy irregular  Abdomen:  Distended, gentle palpation  Extremities: + UE and LE edema    Allergies  Allergen Reactions  . Penicillins     Hives Because of a history of documented adverse serious drug reaction;Medi Alert bracelet  is recommended  . Sulfonamide Derivatives     She denies allergy to Sulfa    Medications: Scheduled Meds:     Continuous Infusions: . dextrose 5 % and 0.9% NaCl 10 mL/hr at 03/31/14 1526  . morphine 0.5 mg/hr (04/25/2014 1115)    PRN Meds: acetaminophen, acetaminophen, atropine, HYDROmorphone (DILAUDID) injection, LORazepam, ondansetron (ZOFRAN) IV  Palliative Performance Scale: 10 %   Labs: CBC    Component Value Date/Time   WBC 8.5 03/28/2014 0500   RBC 4.11 03/28/2014 0500   HGB 11.8* 03/28/2014 0500   HCT 36.6 03/28/2014 0500   PLT 171 03/28/2014 0500   MCV 89.1 03/28/2014 0500   MCH 28.7 03/28/2014 0500   MCHC 32.2 03/28/2014 0500   RDW 14.5 03/28/2014 0500   LYMPHSABS 1.4 Apr 04, 2014 1744   MONOABS 0.9 04-04-2014 1744   EOSABS 0.0 04/04/14 1744   BASOSABS 0.0 2014/04/04 1744    CMET     Component Value Date/Time   NA 137 03/28/2014 0500   K 4.4 03/28/2014 0500   CL 101 03/28/2014 0500   CO2 25 03/28/2014 0500   GLUCOSE 61* 03/28/2014 0500   BUN 21 03/28/2014 0500   CREATININE 0.67 03/28/2014 0500   CALCIUM 8.5 03/28/2014 0500   PROT 5.2* 03/28/2014 0500   ALBUMIN  2.3* 03/28/2014 0500   AST 31 03/28/2014 0500   ALT 15 03/28/2014 0500   ALKPHOS 128* 03/28/2014 0500   BILITOT 0.7 03/28/2014 0500   GFRNONAA 76* 03/28/2014 0500   GFRAA 51* 03/28/2014 0500    ASSESSMENT/ PLAN:  80 yo woman with cecal ileus secondary to large obstructing mass.  Actively dying, on full comfort care. She is approaching EOL <24 hours. Not stable for transfer and not a candidate for [facility]IP hospice transition due to immanent death.  Start Morphine infusion at 0.5mg .hr if she continues to require q hourly boluses.  Scop patch.  I discussed removing her nasal cannula O2-family will consider-they are waiting on patient's daughter to arrive.    Time In: 1020 Time Out: 10:45 Total Time Spent with Patient: 25 minutes Total Overall Time:    Greater than 50%  of this time was spent counseling and coordinating care related to the above assessment and plan.   Acquanetta Chain, DO  03/28/2014, 5:45 PM  Please contact Palliative Medicine Team phone at [phone] for questions and concerns.

## 2014-04-27 NOTE — Progress Notes (Signed)
[facility] PROGRESS NOTE  Ana Fisher JXB:[id number] DOB: 1923/01/24 DOA: 04/15/2014 PCP: [name], MD  Assessment/Plan:  Cecal volvulus  - status post cecostomy percutaneous tube placement by IR 04/11/2014; she had less distention based on recent abdominal films and per IR continue current irrigation  - on clear liquids; able to tolerate some solid food such as pudding  - patient and family have opted for comfort care only.  Hyponatremia  - mild, likely dehydration or lasix  - has resolved to normal with IV fluids   Chronic CHF, diastolic  - BNP elevated at 1262 but pt dehydrated clinically so lasix was put on hold while we were giving IV fluids. Since focus is on comfort we have stopped all PO meds except for pain meds per family wishes.  - 2 D ECHO in 11/2013 showed normal EF   Moderate protein calorie malnutrition  - secondary to malignancy  - clear liquids or ensure pudding.   Code Status: DNR Family Communication: Daughter and son at bedside updated on plan of care.  Disposition Plan: Likely hospital death. May consider [place] transfer in am is survival is anticipated to be more prolonged.   Consultants:  Palliative care   Antibiotics:  None   Subjective: In bed, can answer simple questions.  Objective: Filed Vitals:   03/30/14 2206 03/31/14 0547 03/31/14 2054 04/08/2014 0500  BP: 155/87 149/66 94/67   Pulse: 109 99 155   Temp:  98.8 F (37.1 C) 97.7 F (36.5 C)   TempSrc: Oral Oral Oral   Resp: 18 18 18    Height:      Weight:    55 kg (121 lb 4.1 oz)  SpO2: 94% 93% 73%     Intake/Output Summary (Last 24 hours) at Apr 03, 2014 0933 Last data filed at 04/01/14 1500  Gross per 24 hour  Intake    280 ml  Output      0 ml  Net    280 ml   Filed Weights   03/29/14 0534 03/30/14 0500 04/14/2014 0500  Weight: 54.4 kg (119 lb 14.9 oz) 54.9 kg (121 lb 0.5 oz) 55 kg (121 lb 4.1 oz)    Exam:   General:  awake  Cardiovascular:  tachy, regular  Respiratory: bilateral ronchi  Abdomen: S/NT/ND/+BS  Extremities: no C/C/E   Neurologic:  Non-focal  Data Reviewed: Basic Metabolic Panel:  Recent Labs Lab 03/31/2014 1105 04/17/2014 1411 04/20/2014 1744 03/28/14 0500  NA 132*  --  133* 137  K 4.1  --  4.5 4.4  CL 93*  --  95* 101  CO2 25  --  25 25  GLUCOSE 89  --  79 61*  BUN 24*  --  23 21  CREATININE 0.66  --  0.68 0.67  CALCIUM 9.3  --  9.3 8.5  MG  --  1.9  --   --   PHOS  --   --  3.5  --    Liver Function Tests:  Recent Labs Lab 04/25/2014 1105 04/11/2014 1744 03/28/14 0500  AST 28 34 31  ALT 17 16 15   ALKPHOS 124* 135* 128*  BILITOT 0.7 0.8 0.7  PROT 6.4 5.9* 5.2*  ALBUMIN 3.1* 2.9* 2.3*    Recent Labs Lab 04/13/2014 1105  LIPASE 480*   No results found for this basename: AMMONIA,  in the last 168 hours CBC:  Recent Labs Lab 04/12/2014 1105 04/05/2014 1744 03/28/14 0500  WBC 10.4 12.2* 8.5  NEUTROABS 8.5*  9.8*  --   HGB 12.9 13.4 11.8*  HCT 38.5 39.8 36.6  MCV 87.3 87.9 89.1  PLT 191 186 171   Cardiac Enzymes: No results found for this basename: CKTOTAL, CKMB, CKMBINDEX, TROPONINI,  in the last 168 hours BNP (last 3 results)  Recent Labs  12/03/13 1020 03/18/14 1211 03-29-2014 1105  PROBNP 145.0* 159.0* 1262.0*   CBG:  Recent Labs Lab 03/28/14 0625  GLUCAP 66*    No results found for this or any previous visit (from the past 240 hour(s)).   Studies: Dg Abd Portable 2v  03/31/2014   CLINICAL DATA:  Post recent cecostomy.  Abdominal distention.  EXAM: PORTABLE ABDOMEN - 2 VIEW  COMPARISON:  03/29/14  FINDINGS: Catheter projects over the right lower quadrant compatible with cecostomy, new since prior study. Decreasing gaseous distension of the colon, particularly the cecum which currently measures approximately 9 cm in diameter relative to 12.4 cm previously. No free air. No organomegaly.  IMPRESSION: Decreasing gaseous distention of the cecum following cecostomy.    Electronically Signed   By: Rolm Baptise M.D.   On: 03/31/2014 10:26    Scheduled Meds:  Continuous Infusions: . dextrose 5 % and 0.9% NaCl 10 mL/hr at 03/31/14 1526    Principal Problem:   Colonic obstruction  Active Problems:   Diastolic CHF, chronic   Ascites   Liver metastases   Cecal volvulus    Time spent: 25 minutes. Greater than 50% of this time was spent in direct contact with the patient coordinating care.    [facility] Pager [phone]  If 7PM-7AM, please contact night-coverage at [email or web address], password [facility] 03/31/2014, 9:33 AM  LOS: 6 days

## 2014-04-27 DEATH — deceased

## 2015-03-01 IMAGING — CT CT ABD-PELV W/O CM
2 of 4 series · 16 of 46 positions shown, 18 images · non-contrast
Comparison: DG ABD ACUTE W/CHEST dated 03/27/2014

CLINICAL DATA: evaluate for cecal volvulus

EXAM:
CT ABDOMEN AND PELVIS WITHOUT CONTRAST
TECHNIQUE: Multidetector CT imaging of the abdomen and pelvis was performed
following the standard protocol without intravenous contrast.

[Series 2: rtn ap without · axial · non-contrast · 0.78mm/px · z∈[-638,-258]mm · 13 of 84 slices shown, 15 images]
[im 4/84  soft-tissue]
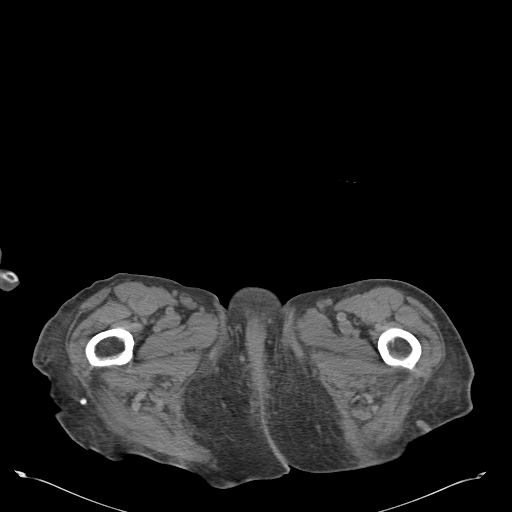
[im 4/84  bone]
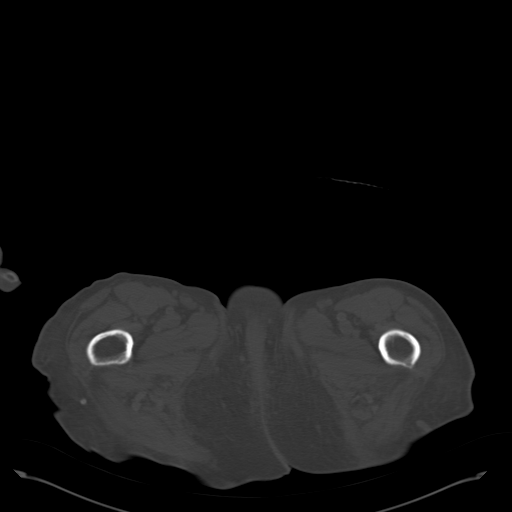
[im 11/84  soft-tissue]
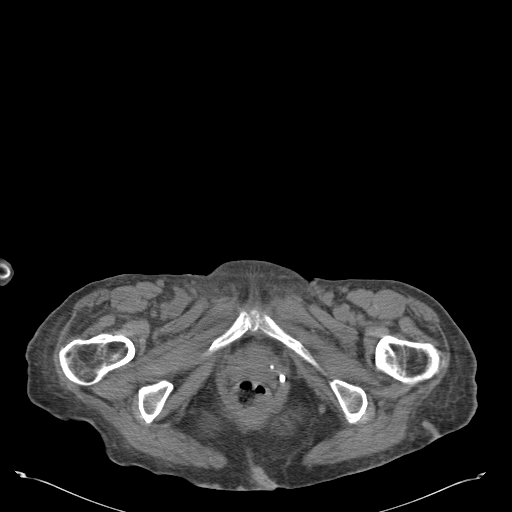
[im 18/84  soft-tissue]
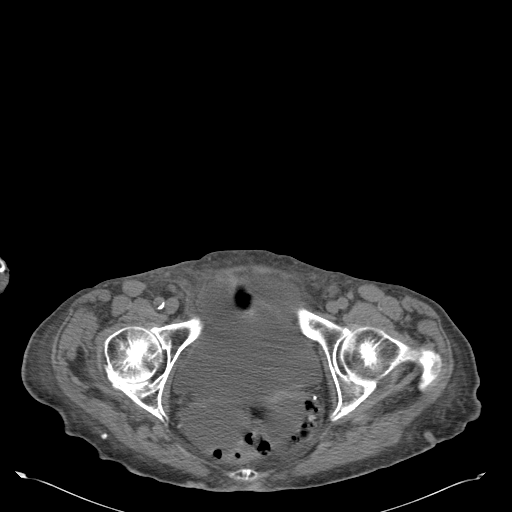
[im 25/84  soft-tissue]
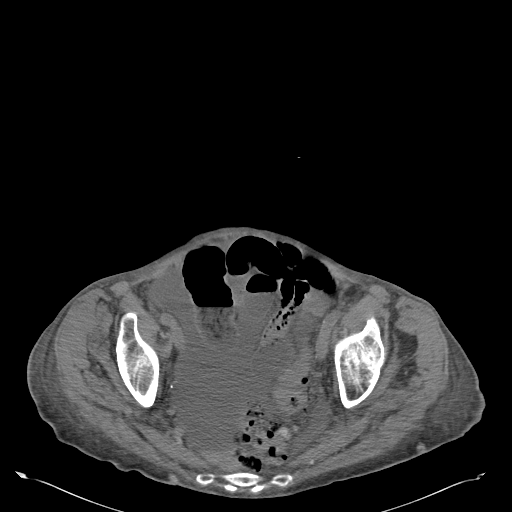
[im 28/84  soft-tissue]
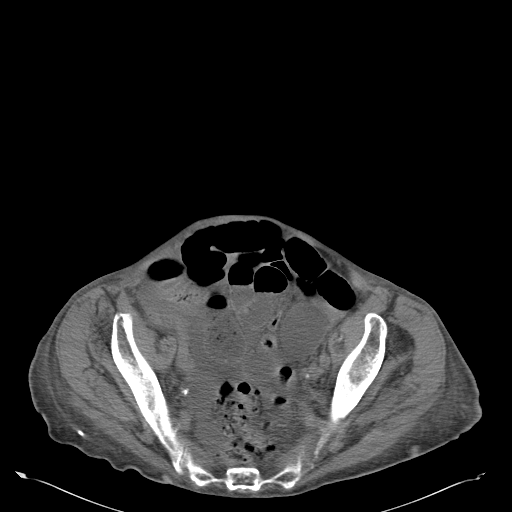
[im 35/84  soft-tissue]
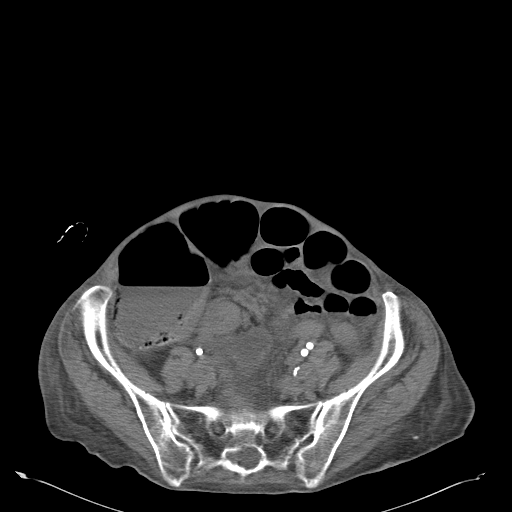
[im 42/84  soft-tissue]
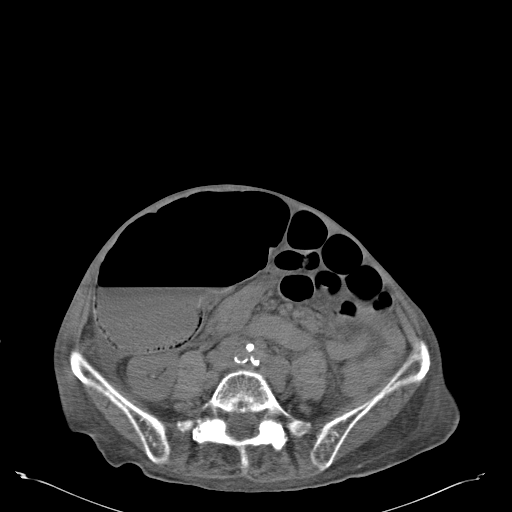
[im 49/84  soft-tissue]
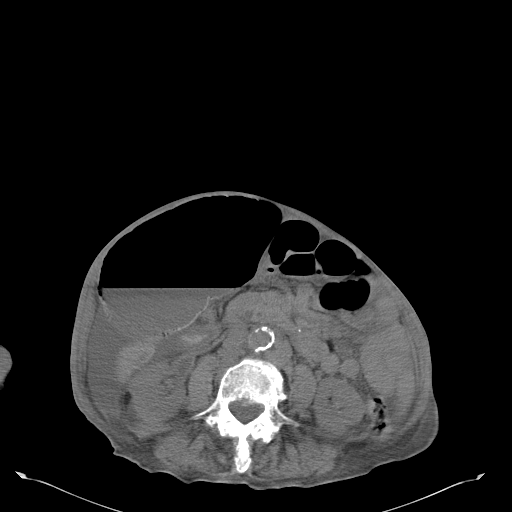
[im 56/84  soft-tissue]
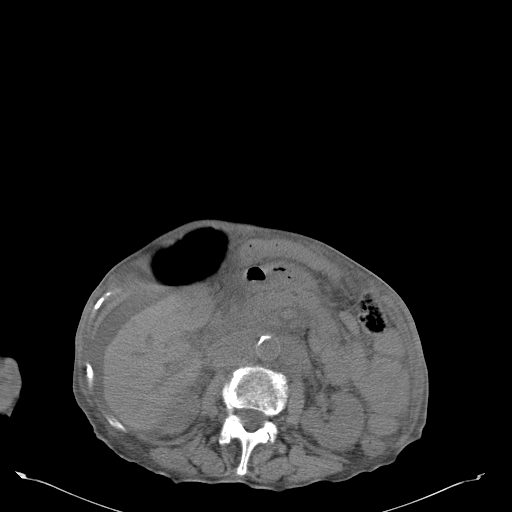
[im 56/84  bone]
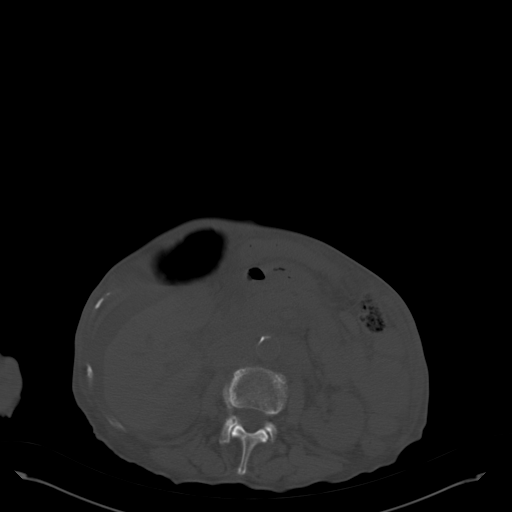
[im 59/84  soft-tissue]
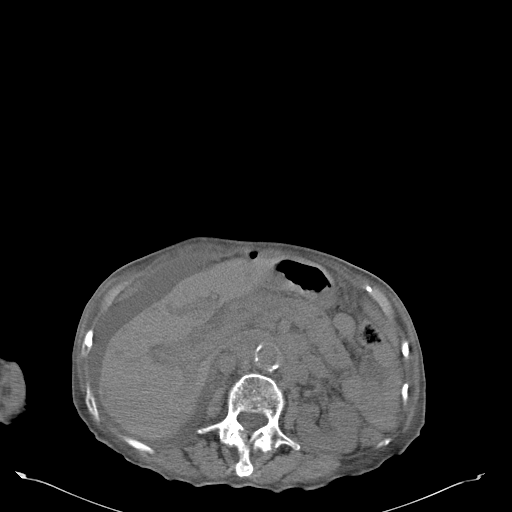
[im 66/84  soft-tissue]
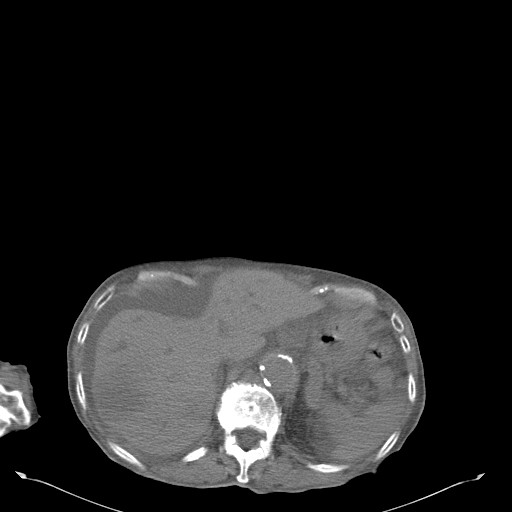
[im 73/84  soft-tissue]
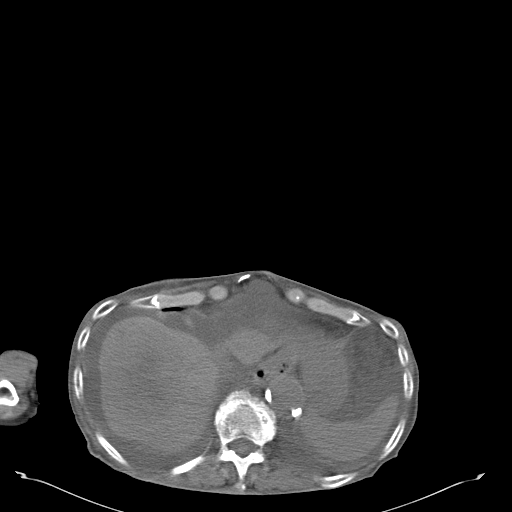
[im 80/84  soft-tissue]
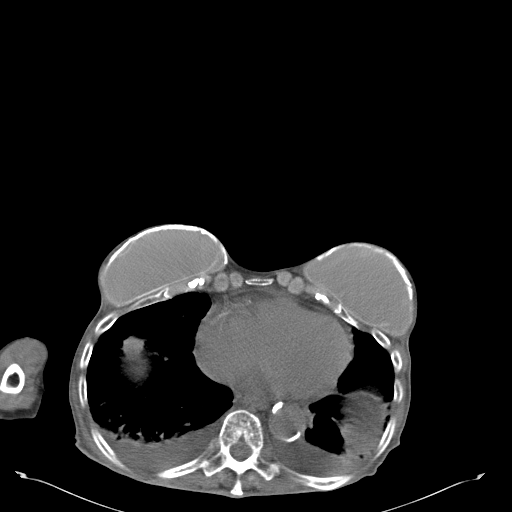

[Series 602: <mpr thick range> · coronal · 0.85mm/px · 3 of 87 slices shown]
[im 29/87  soft-tissue]
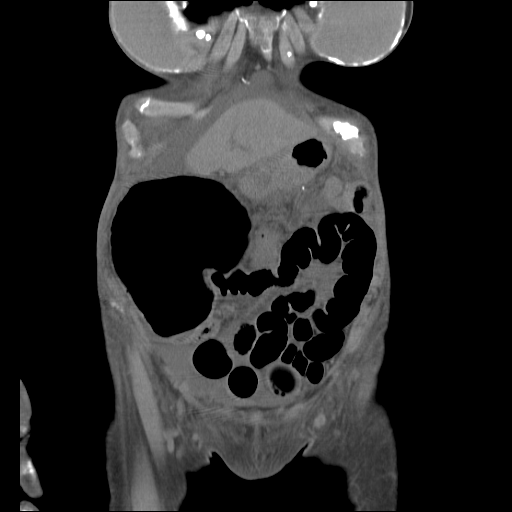
[im 39/87  soft-tissue]
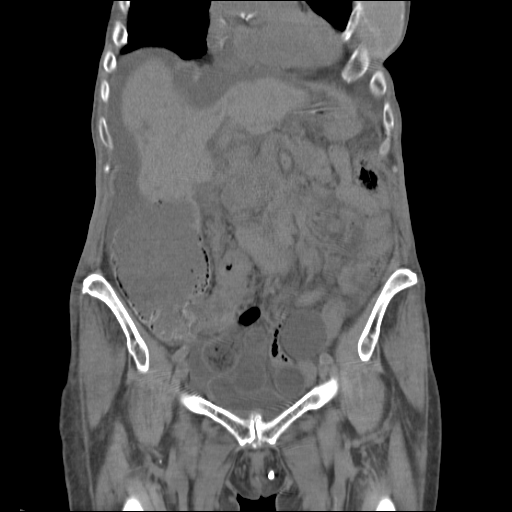
[im 48/87  soft-tissue]
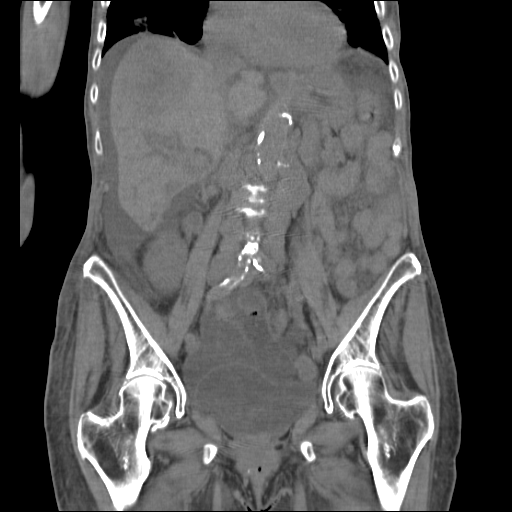

[16 of 46 positions shown; findings below may reference images not displayed]

FINDINGS: Small bilateral effusions are appreciated within the lung bases.
There is diffuse thickening of the interstitial markings with areas
of tree-in-bud configuration. Areas of increased density are in the
dependent portions of the lung bases. A 1.5 cm nodule is appreciated
within the right lung base concerning for metastatic disease
considering the patient's history.

Diffuse ascites is appreciated within the abdomen. There is diffuse
dilation of the cecum consistent with the patient's history of cecal
volvulus with maximum diameters of 11 cm. There are areas concerning
for pneumatosis within the bowel wall in the nondependent posterior
portion of the cecum images 41 through 51. The transition point
appears to be within the pelvis on image 39 coronal series and
images 55-57 series 2. Multiple compensatory dilated loops of small
bowel are appreciated within the lower abdomen and pelvis containing
fluid and air.

The liver is atrophic and contains multiple low attenuating masses
primarily in the right lobe. There is diffuse periportal edema.

Evaluation of the right kidney demonstrates a nonobstructing 6 mm
calculus medullary portion of the lower pole. There are multiple
multiple calculi along the course of the mid ureter on the right. A
nonobstructing medullary calculus identified within the left kidney.
There is no evidence of hydronephrosis on the right or left.

The spleen, pancreas, adrenals are unremarkable.

Atherosclerotic calcifications are identified within aorta. There is
no evidence of an abdominal aortic aneurysm.

Multilevel spondylosis is appreciated within the spine. There does
not appear to be aggressive appearing osseous lesions. Compression
deformities are appreciated within the lower lumbar spine.
IMPRESSION: Findings consistent with a cecal volvulus with areas concerning for
pneumatosis in the nondependent wall of the bowel. Critical
Value/emergent results were called by telephone at the time of
interpretation on 03/27/2014 at [DATE] to Dr. CELESTINE, who
verbally acknowledged these results.

A compensatory small bowel obstruction is appreciated.

Diffuse ascites

Bibasilar interstitial infiltrates and small effusions. Differential
considerations include pulmonary edema, infectious interstitial
infiltrates, lymphangitic neoplastic infiltration.

Findings consistent with diffuse metastatic disease within the liver
until proven otherwise and concerning for metastatic pulmonary
nodule right lung base.

Bilateral nephrolithiasis without hydronephrosis.

## 2015-03-01 IMAGING — XA IR PERC PLACEMENT COLONIC TUBE
2 series · 8 of 8 positions shown · non-contrast
Comparison: CT ABD/PELV WO CM dated 03/27/2014

INDICATION: Recent diagnosis of suspected obstructing ascending colonic mass
with multiple hepatic and pulmonary lesions worrisome for metastatic
disease. Additionally, the cecum is markedly dilated with findings
of pneumatosis. The patient is a poor endoscopy and surgical
candidate and as such, the request has been made for placement of a
decompressive cecostomy tube for palliation of symptoms.

EXAM:
FLUOROSCOPIC GUIDED PUSH CECOSTOMY TUBE PLACEMENT

[Series 1: care single · 1 of 1 slices shown]
[im 1/1]
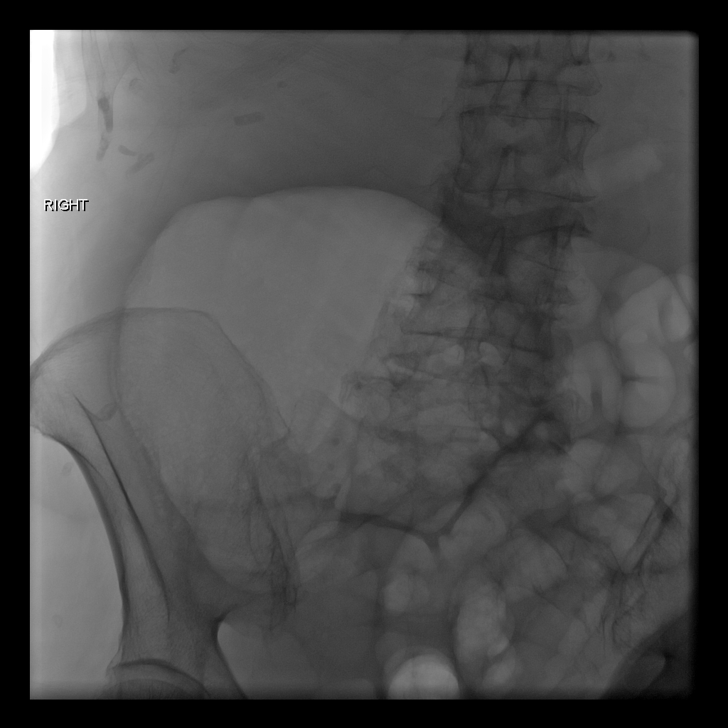

[Series 300: sp ceco/colonic tube insert percut w/flu · 7 of 7 slices shown]
[im 1/7]
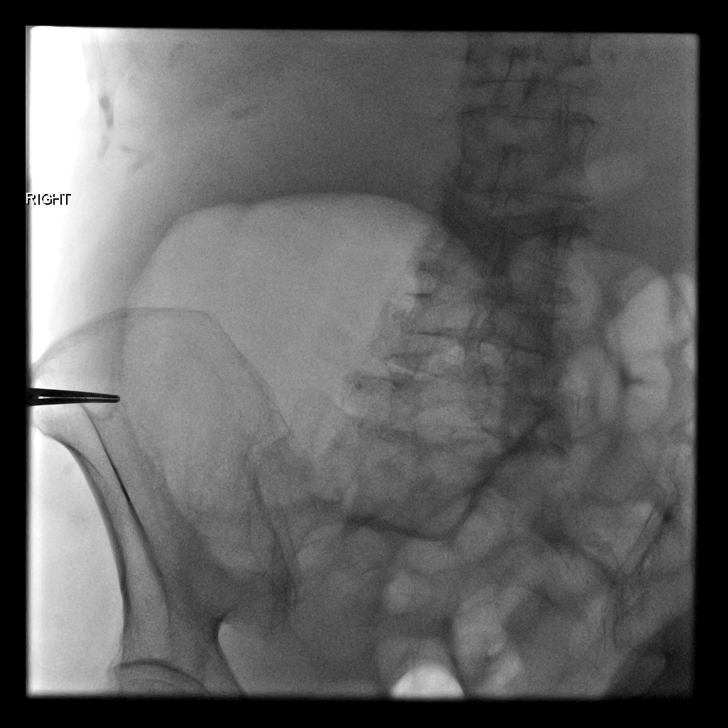
[im 2/7]
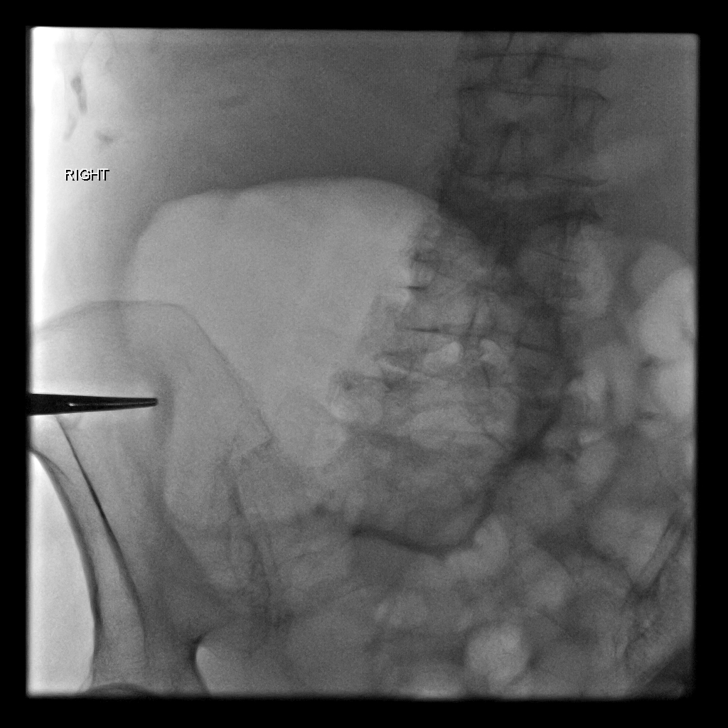
[im 3/7]
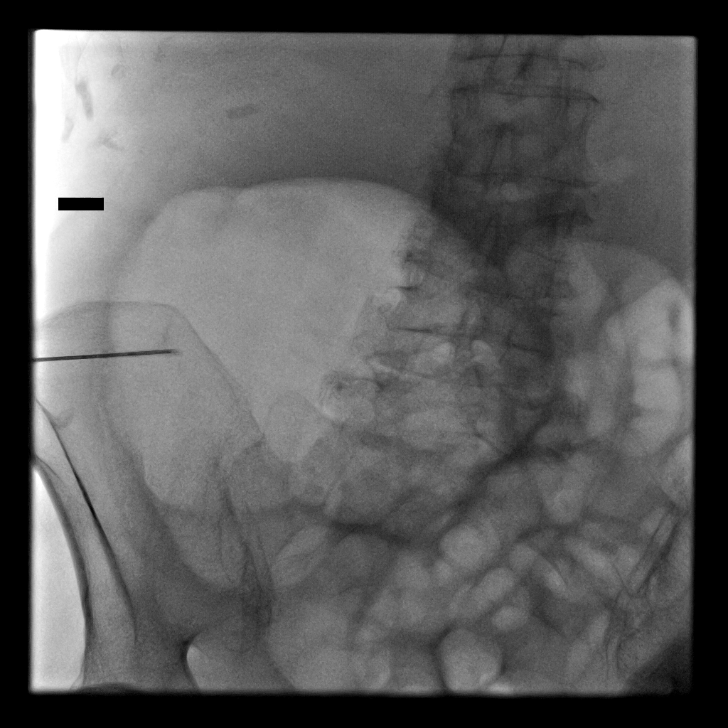
[im 4/7]
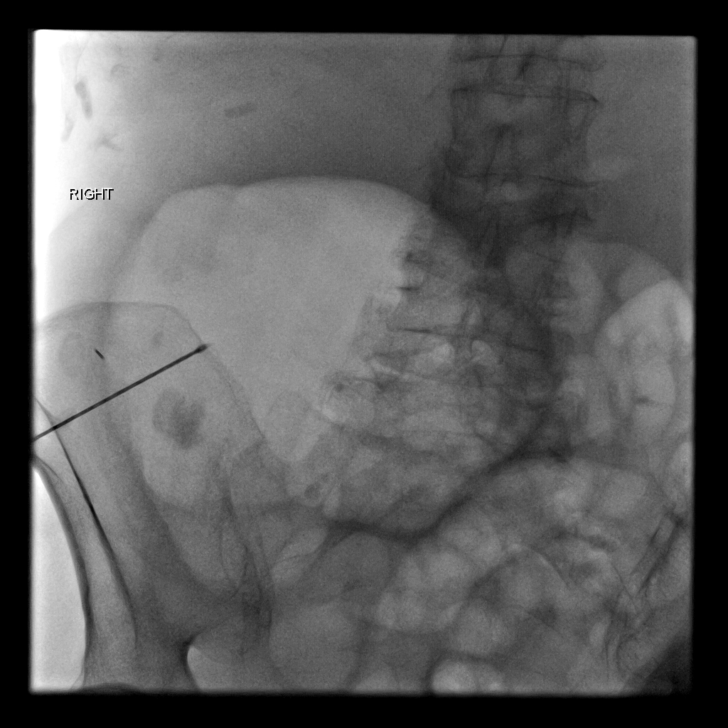
[im 5/7]
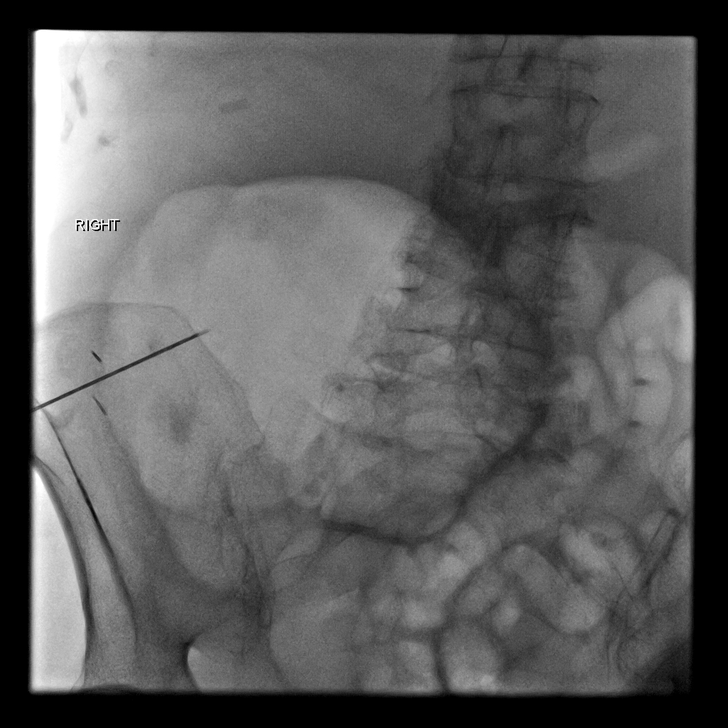
[im 6/7]
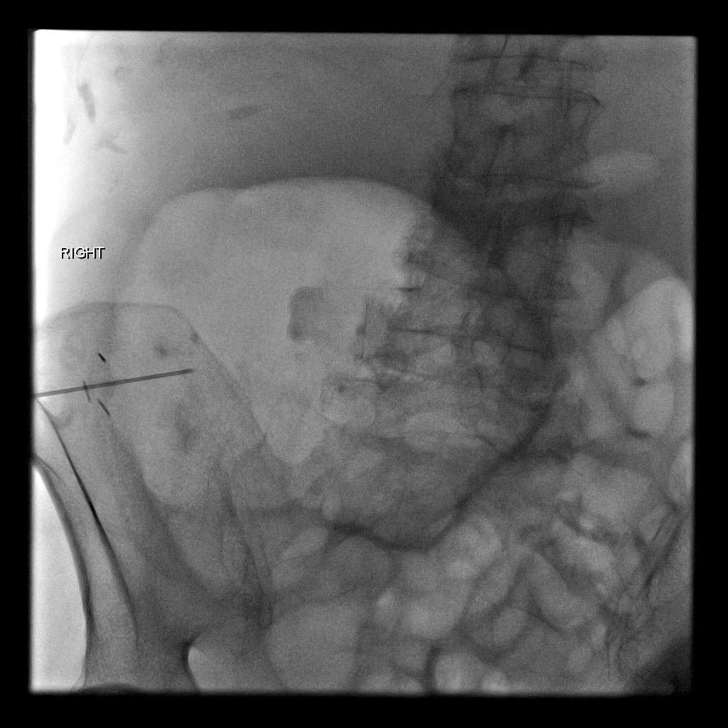
[im 7/7]
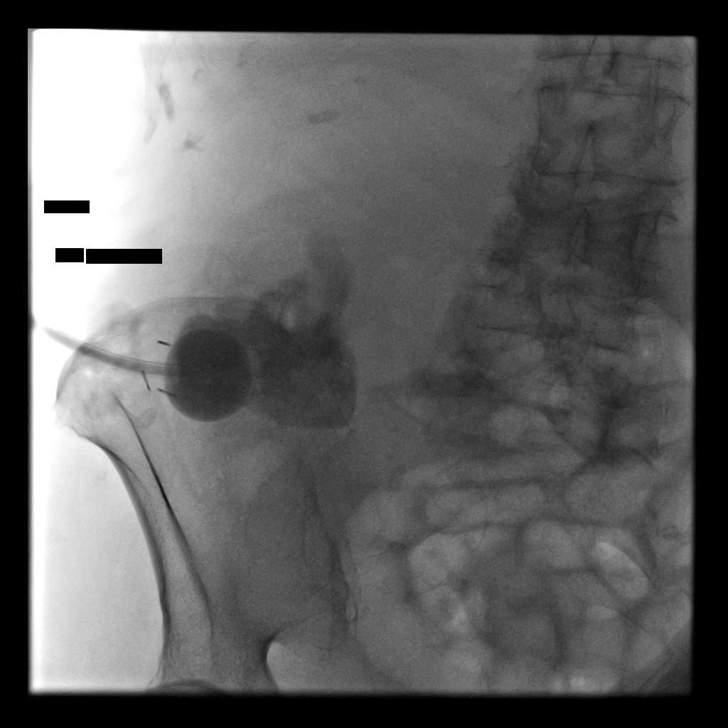

[8 of 8 positions shown; findings below may reference images not displayed]

MEDICATIONS:
Ciprofloxacin 400 mg IV

Antibiotics were administered within 1 hour of the procedure.

CONTRAST:  Approximately 20 mL of Isovue 300 administered into the
cecum

ANESTHESIA/SEDATION:
Versed 1 mg IV; Fentanyl 50 mcg IV.

Sedation time

15 minutes

FLUOROSCOPY TIME:  1 minute, 24 seconds.

COMPLICATIONS:
None immediate

PROCEDURE:
Informed written consent was obtained from the patient and the
patient's family following explanation of the procedure, risks,
benefits and alternatives. A time out was performed prior to the
initiation of the procedure. Maximal barrier sterile technique
utilized including caps, mask, sterile gowns, sterile gloves, large
sterile drape, hand hygiene and Betadine prep.

The right lower abdominal quadrant was sterilely prepped and draped.
A preprocedural fluoroscopic image was obtained demonstrating
persistent marked gas distention of the cecum within the right lower
abdominal quadrant. Under sterile conditions and local anesthesia, 3
T tacks were utilized to pexy the anterior lateral aspect of the
cecum against the ventral abdominal wall. Contrast injection
confirmed appropriate positioning of each of the T tacks. An
incision was made between the T tacks and a 17 gauge trocar needle
was utilized to access the cecum. Needle position was confirmed
within the cecum with aspiration of air and injection of a small
amount of contrast. A stiff Amplatz was advanced into the cecum and
under intermittent fluoroscopic guidance a telescoping peel-away
sheath was placed ultimately allowing placement of an 18-French
balloon retention cecostomy tube. The retention balloon was
insufflated with a mixture of dilute saline and contrast and pulled
taut against the anterior lateral wall of the cecum. The external
disc was cinched. Contrast injection confirms positioning within the
cecum. A postprocedural spot fluoroscopic image was obtained. The
patient tolerated procedure well without immediate post procedural
complication.
FINDINGS: After successful fluoroscopic guided placement, the cecostomy tube
is appropriately positioned with internal retention balloon against
the ventral aspect of the cecum.
IMPRESSION: Successful fluoroscopic insertion of an 18 French balloon retention
cecostomy tube.

The cecostomy tube will be placed to low wall suction overnight and
then as directed by the ordering surgical service.

## 2015-03-05 IMAGING — CR DG ABD PORTABLE 2V
1 series · 2 of 2 positions shown · non-contrast
Comparison: 03/27/2014

CLINICAL DATA: Post recent cecostomy.  Abdominal distention.

EXAM:
PORTABLE ABDOMEN - 2 VIEW

[Series 1: ap (kub) · U · 2 of 2 slices shown]
[im 1/2]
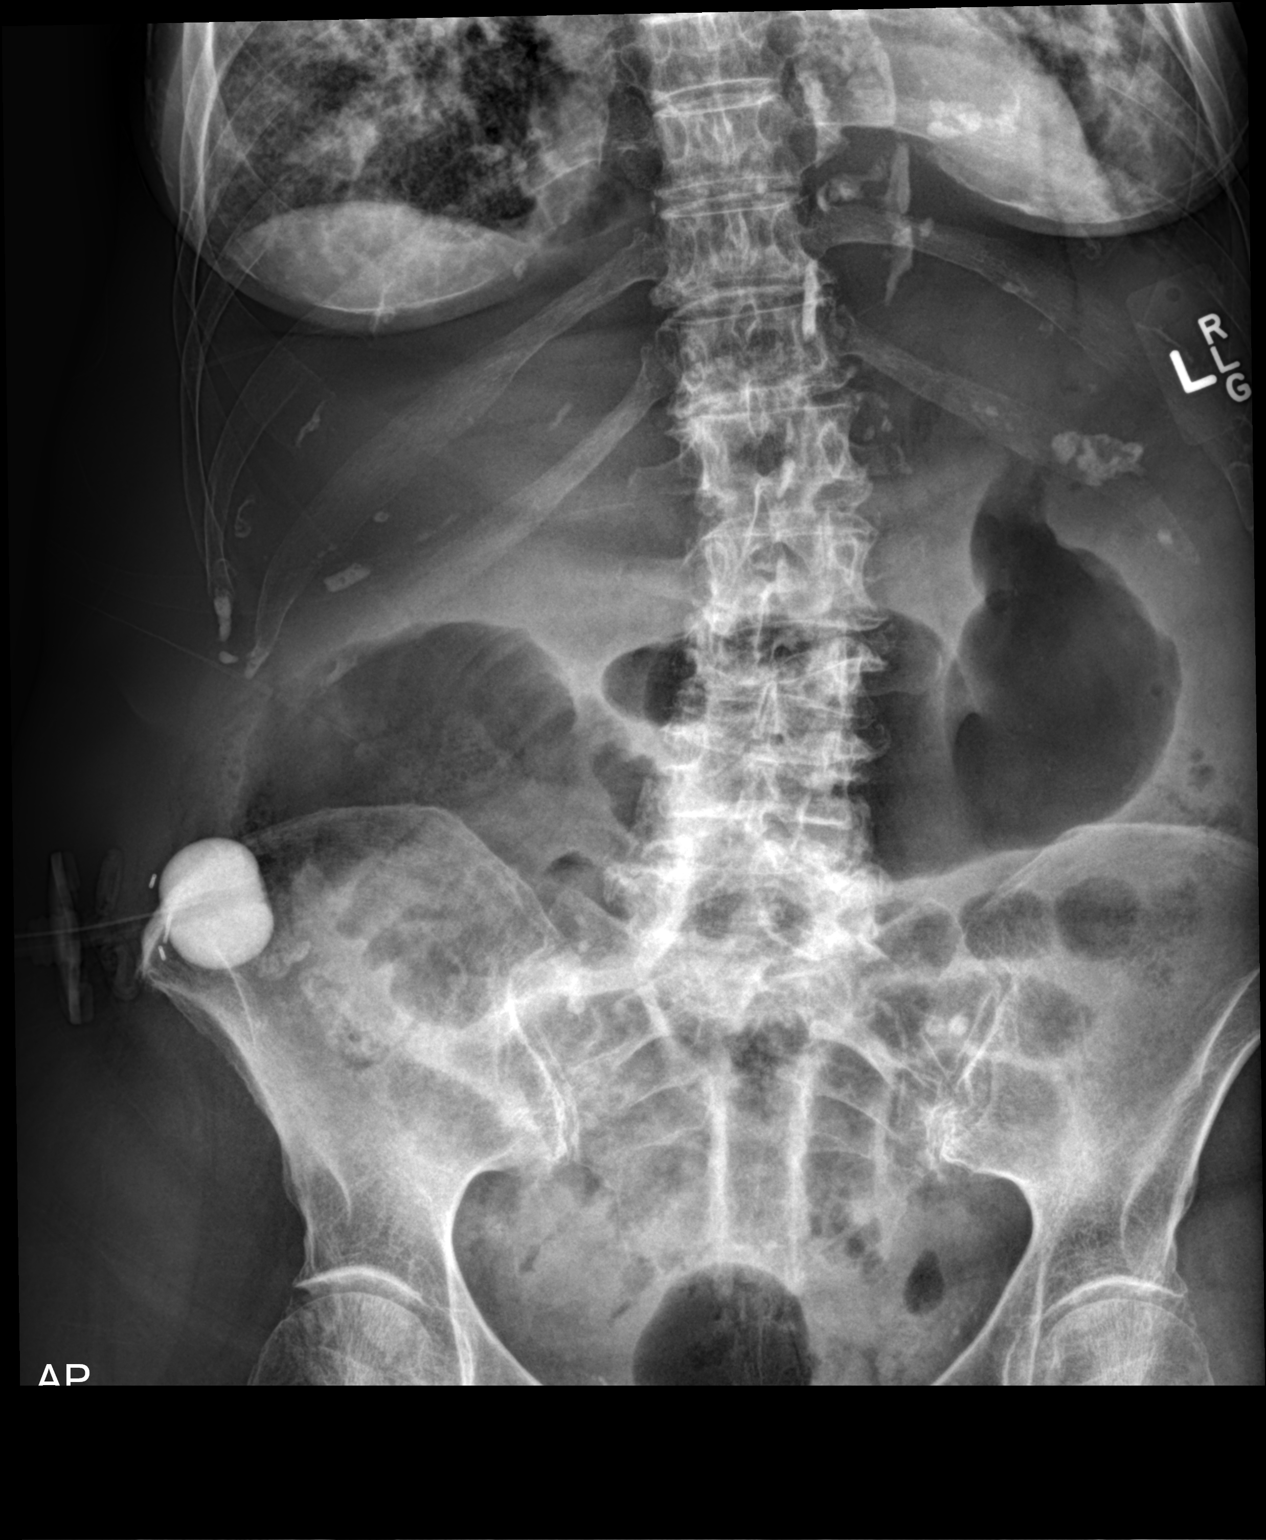
[im 2/2]
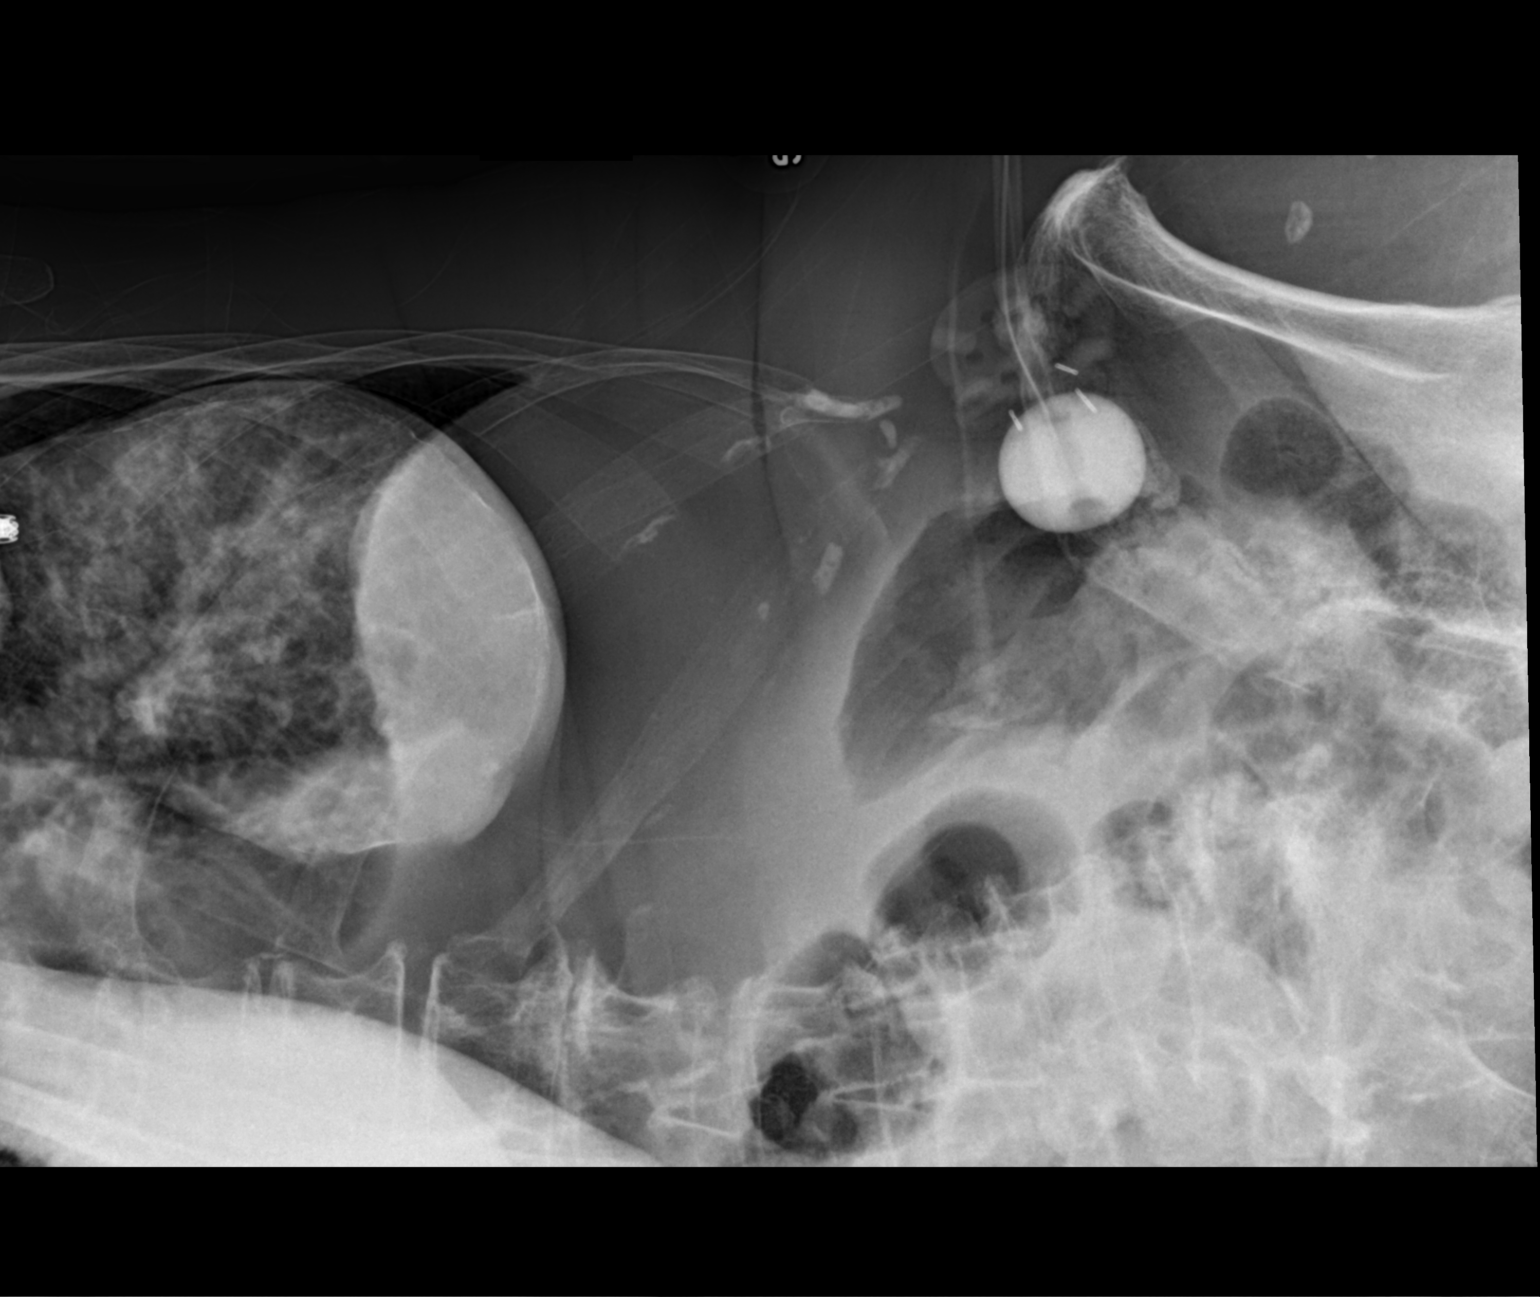

[2 of 2 positions shown; findings below may reference images not displayed]

FINDINGS: Catheter projects over the right lower quadrant compatible with
cecostomy, new since prior study. Decreasing gaseous distension of
the colon, particularly the cecum which currently measures
approximately 9 cm in diameter relative to 12.4 cm previously. No
free air. No organomegaly.
IMPRESSION: Decreasing gaseous distention of the cecum following cecostomy.
